# Patient Record
Sex: Female | Born: 1935 | Race: Black or African American | Hispanic: No | Marital: Single | Smoking: Never smoker
Health system: Southern US, Community
[De-identification: ages and names within clinical notes are randomized; demographics above are authoritative.]

## PROBLEM LIST (undated history)

## (undated) DIAGNOSIS — N183 Chronic kidney disease, stage 3 unspecified: Secondary | ICD-10-CM

## (undated) DIAGNOSIS — E119 Type 2 diabetes mellitus without complications: Secondary | ICD-10-CM

## (undated) DIAGNOSIS — D509 Iron deficiency anemia, unspecified: Secondary | ICD-10-CM

## (undated) DIAGNOSIS — I5030 Unspecified diastolic (congestive) heart failure: Secondary | ICD-10-CM

---

## 2015-06-08 DIAGNOSIS — E119 Type 2 diabetes mellitus without complications: Secondary | ICD-10-CM | POA: Diagnosis not present

## 2015-06-08 DIAGNOSIS — E785 Hyperlipidemia, unspecified: Secondary | ICD-10-CM | POA: Diagnosis not present

## 2015-06-08 DIAGNOSIS — I10 Essential (primary) hypertension: Secondary | ICD-10-CM | POA: Diagnosis not present

## 2015-06-19 DIAGNOSIS — E785 Hyperlipidemia, unspecified: Secondary | ICD-10-CM | POA: Diagnosis not present

## 2015-06-19 DIAGNOSIS — Z13220 Encounter for screening for lipoid disorders: Secondary | ICD-10-CM | POA: Diagnosis not present

## 2015-06-19 DIAGNOSIS — E119 Type 2 diabetes mellitus without complications: Secondary | ICD-10-CM | POA: Diagnosis not present

## 2015-06-19 DIAGNOSIS — Z131 Encounter for screening for diabetes mellitus: Secondary | ICD-10-CM | POA: Diagnosis not present

## 2015-07-11 DIAGNOSIS — L84 Corns and callosities: Secondary | ICD-10-CM | POA: Diagnosis not present

## 2015-07-11 DIAGNOSIS — E119 Type 2 diabetes mellitus without complications: Secondary | ICD-10-CM | POA: Diagnosis not present

## 2015-07-11 DIAGNOSIS — M2040 Other hammer toe(s) (acquired), unspecified foot: Secondary | ICD-10-CM | POA: Diagnosis not present

## 2015-07-11 DIAGNOSIS — Z760 Encounter for issue of repeat prescription: Secondary | ICD-10-CM | POA: Diagnosis not present

## 2015-07-11 DIAGNOSIS — E785 Hyperlipidemia, unspecified: Secondary | ICD-10-CM | POA: Diagnosis not present

## 2015-07-11 DIAGNOSIS — I10 Essential (primary) hypertension: Secondary | ICD-10-CM | POA: Diagnosis not present

## 2015-07-11 DIAGNOSIS — B351 Tinea unguium: Secondary | ICD-10-CM | POA: Diagnosis not present

## 2015-07-11 DIAGNOSIS — E1142 Type 2 diabetes mellitus with diabetic polyneuropathy: Secondary | ICD-10-CM | POA: Diagnosis not present

## 2015-07-17 DIAGNOSIS — E119 Type 2 diabetes mellitus without complications: Secondary | ICD-10-CM | POA: Diagnosis not present

## 2015-07-17 DIAGNOSIS — S0003XA Contusion of scalp, initial encounter: Secondary | ICD-10-CM | POA: Diagnosis not present

## 2015-07-17 DIAGNOSIS — E785 Hyperlipidemia, unspecified: Secondary | ICD-10-CM | POA: Diagnosis not present

## 2015-07-17 DIAGNOSIS — R51 Headache: Secondary | ICD-10-CM | POA: Diagnosis not present

## 2015-07-17 DIAGNOSIS — G311 Senile degeneration of brain, not elsewhere classified: Secondary | ICD-10-CM | POA: Diagnosis not present

## 2015-07-17 DIAGNOSIS — Z79899 Other long term (current) drug therapy: Secondary | ICD-10-CM | POA: Diagnosis not present

## 2015-07-17 DIAGNOSIS — S098XXA Other specified injuries of head, initial encounter: Secondary | ICD-10-CM | POA: Diagnosis not present

## 2015-07-17 DIAGNOSIS — M47812 Spondylosis without myelopathy or radiculopathy, cervical region: Secondary | ICD-10-CM | POA: Diagnosis not present

## 2015-07-17 DIAGNOSIS — S199XXA Unspecified injury of neck, initial encounter: Secondary | ICD-10-CM | POA: Diagnosis not present

## 2015-09-05 DIAGNOSIS — E785 Hyperlipidemia, unspecified: Secondary | ICD-10-CM | POA: Diagnosis not present

## 2015-09-05 DIAGNOSIS — Z79899 Other long term (current) drug therapy: Secondary | ICD-10-CM | POA: Diagnosis not present

## 2015-09-05 DIAGNOSIS — M25551 Pain in right hip: Secondary | ICD-10-CM | POA: Diagnosis not present

## 2015-09-05 DIAGNOSIS — E119 Type 2 diabetes mellitus without complications: Secondary | ICD-10-CM | POA: Diagnosis not present

## 2015-09-05 DIAGNOSIS — Z7984 Long term (current) use of oral hypoglycemic drugs: Secondary | ICD-10-CM | POA: Diagnosis not present

## 2015-09-05 DIAGNOSIS — M79604 Pain in right leg: Secondary | ICD-10-CM | POA: Diagnosis not present

## 2016-02-23 DIAGNOSIS — Z1231 Encounter for screening mammogram for malignant neoplasm of breast: Secondary | ICD-10-CM | POA: Diagnosis not present

## 2016-03-04 DIAGNOSIS — E1065 Type 1 diabetes mellitus with hyperglycemia: Secondary | ICD-10-CM | POA: Diagnosis not present

## 2016-03-22 DIAGNOSIS — E7800 Pure hypercholesterolemia, unspecified: Secondary | ICD-10-CM | POA: Diagnosis not present

## 2016-03-22 DIAGNOSIS — D649 Anemia, unspecified: Secondary | ICD-10-CM | POA: Diagnosis not present

## 2016-03-22 DIAGNOSIS — I10 Essential (primary) hypertension: Secondary | ICD-10-CM | POA: Diagnosis not present

## 2016-03-22 DIAGNOSIS — Z0189 Encounter for other specified special examinations: Secondary | ICD-10-CM | POA: Diagnosis not present

## 2016-03-22 DIAGNOSIS — E119 Type 2 diabetes mellitus without complications: Secondary | ICD-10-CM | POA: Diagnosis not present

## 2016-05-27 DIAGNOSIS — E1065 Type 1 diabetes mellitus with hyperglycemia: Secondary | ICD-10-CM | POA: Diagnosis not present

## 2016-05-27 DIAGNOSIS — Z0000 Encounter for general adult medical examination without abnormal findings: Secondary | ICD-10-CM | POA: Diagnosis not present

## 2016-06-05 DIAGNOSIS — E1165 Type 2 diabetes mellitus with hyperglycemia: Secondary | ICD-10-CM | POA: Diagnosis not present

## 2016-06-05 DIAGNOSIS — F79 Unspecified intellectual disabilities: Secondary | ICD-10-CM | POA: Diagnosis not present

## 2016-06-05 DIAGNOSIS — E8889 Other specified metabolic disorders: Secondary | ICD-10-CM | POA: Diagnosis not present

## 2016-06-05 DIAGNOSIS — R32 Unspecified urinary incontinence: Secondary | ICD-10-CM | POA: Diagnosis not present

## 2016-06-10 DIAGNOSIS — R309 Painful micturition, unspecified: Secondary | ICD-10-CM | POA: Diagnosis not present

## 2016-06-10 DIAGNOSIS — E1169 Type 2 diabetes mellitus with other specified complication: Secondary | ICD-10-CM | POA: Diagnosis not present

## 2016-06-10 DIAGNOSIS — L84 Corns and callosities: Secondary | ICD-10-CM | POA: Diagnosis not present

## 2016-06-10 DIAGNOSIS — E119 Type 2 diabetes mellitus without complications: Secondary | ICD-10-CM | POA: Diagnosis not present

## 2016-06-10 DIAGNOSIS — D520 Dietary folate deficiency anemia: Secondary | ICD-10-CM | POA: Diagnosis not present

## 2016-06-10 DIAGNOSIS — E559 Vitamin D deficiency, unspecified: Secondary | ICD-10-CM | POA: Diagnosis not present

## 2016-06-10 DIAGNOSIS — N289 Disorder of kidney and ureter, unspecified: Secondary | ICD-10-CM | POA: Diagnosis not present

## 2016-06-10 DIAGNOSIS — E039 Hypothyroidism, unspecified: Secondary | ICD-10-CM | POA: Diagnosis not present

## 2016-06-10 DIAGNOSIS — M2040 Other hammer toe(s) (acquired), unspecified foot: Secondary | ICD-10-CM | POA: Diagnosis not present

## 2016-06-10 DIAGNOSIS — R809 Proteinuria, unspecified: Secondary | ICD-10-CM | POA: Diagnosis not present

## 2016-06-10 DIAGNOSIS — E785 Hyperlipidemia, unspecified: Secondary | ICD-10-CM | POA: Diagnosis not present

## 2016-06-10 DIAGNOSIS — D649 Anemia, unspecified: Secondary | ICD-10-CM | POA: Diagnosis not present

## 2016-06-10 DIAGNOSIS — I10 Essential (primary) hypertension: Secondary | ICD-10-CM | POA: Diagnosis not present

## 2016-06-10 DIAGNOSIS — M109 Gout, unspecified: Secondary | ICD-10-CM | POA: Diagnosis not present

## 2016-06-26 DIAGNOSIS — E785 Hyperlipidemia, unspecified: Secondary | ICD-10-CM | POA: Diagnosis not present

## 2016-06-26 DIAGNOSIS — R351 Nocturia: Secondary | ICD-10-CM | POA: Diagnosis not present

## 2016-06-26 DIAGNOSIS — E119 Type 2 diabetes mellitus without complications: Secondary | ICD-10-CM | POA: Diagnosis not present

## 2016-06-26 DIAGNOSIS — I10 Essential (primary) hypertension: Secondary | ICD-10-CM | POA: Diagnosis not present

## 2016-06-26 DIAGNOSIS — D649 Anemia, unspecified: Secondary | ICD-10-CM | POA: Diagnosis not present

## 2016-07-29 DIAGNOSIS — E119 Type 2 diabetes mellitus without complications: Secondary | ICD-10-CM | POA: Diagnosis not present

## 2016-07-29 DIAGNOSIS — I10 Essential (primary) hypertension: Secondary | ICD-10-CM | POA: Diagnosis not present

## 2016-07-29 DIAGNOSIS — Z131 Encounter for screening for diabetes mellitus: Secondary | ICD-10-CM | POA: Diagnosis not present

## 2016-07-29 DIAGNOSIS — D649 Anemia, unspecified: Secondary | ICD-10-CM | POA: Diagnosis not present

## 2016-07-31 DIAGNOSIS — E1065 Type 1 diabetes mellitus with hyperglycemia: Secondary | ICD-10-CM | POA: Diagnosis not present

## 2016-10-07 DIAGNOSIS — E1065 Type 1 diabetes mellitus with hyperglycemia: Secondary | ICD-10-CM | POA: Diagnosis not present

## 2016-10-16 DIAGNOSIS — E119 Type 2 diabetes mellitus without complications: Secondary | ICD-10-CM | POA: Diagnosis not present

## 2016-10-16 DIAGNOSIS — I10 Essential (primary) hypertension: Secondary | ICD-10-CM | POA: Diagnosis not present

## 2016-10-16 DIAGNOSIS — Z0189 Encounter for other specified special examinations: Secondary | ICD-10-CM | POA: Diagnosis not present

## 2016-10-16 DIAGNOSIS — N183 Chronic kidney disease, stage 3 (moderate): Secondary | ICD-10-CM | POA: Diagnosis not present

## 2016-10-16 DIAGNOSIS — E785 Hyperlipidemia, unspecified: Secondary | ICD-10-CM | POA: Diagnosis not present

## 2016-11-15 DIAGNOSIS — E1065 Type 1 diabetes mellitus with hyperglycemia: Secondary | ICD-10-CM | POA: Diagnosis not present

## 2017-01-15 DIAGNOSIS — Z0189 Encounter for other specified special examinations: Secondary | ICD-10-CM | POA: Diagnosis not present

## 2017-01-15 DIAGNOSIS — E119 Type 2 diabetes mellitus without complications: Secondary | ICD-10-CM | POA: Diagnosis not present

## 2017-01-15 DIAGNOSIS — N183 Chronic kidney disease, stage 3 (moderate): Secondary | ICD-10-CM | POA: Diagnosis not present

## 2017-01-15 DIAGNOSIS — M25562 Pain in left knee: Secondary | ICD-10-CM | POA: Diagnosis not present

## 2017-01-15 DIAGNOSIS — D649 Anemia, unspecified: Secondary | ICD-10-CM | POA: Diagnosis not present

## 2017-01-16 DIAGNOSIS — M25462 Effusion, left knee: Secondary | ICD-10-CM | POA: Diagnosis not present

## 2017-01-16 DIAGNOSIS — M25562 Pain in left knee: Secondary | ICD-10-CM | POA: Diagnosis not present

## 2017-01-16 DIAGNOSIS — M25469 Effusion, unspecified knee: Secondary | ICD-10-CM | POA: Diagnosis not present

## 2017-01-27 DIAGNOSIS — M1712 Unilateral primary osteoarthritis, left knee: Secondary | ICD-10-CM | POA: Diagnosis not present

## 2017-02-17 DIAGNOSIS — L720 Epidermal cyst: Secondary | ICD-10-CM | POA: Diagnosis not present

## 2017-02-24 DIAGNOSIS — M1712 Unilateral primary osteoarthritis, left knee: Secondary | ICD-10-CM | POA: Diagnosis not present

## 2017-02-26 DIAGNOSIS — I10 Essential (primary) hypertension: Secondary | ICD-10-CM | POA: Diagnosis not present

## 2017-02-26 DIAGNOSIS — D649 Anemia, unspecified: Secondary | ICD-10-CM | POA: Diagnosis not present

## 2017-02-26 DIAGNOSIS — E119 Type 2 diabetes mellitus without complications: Secondary | ICD-10-CM | POA: Diagnosis not present

## 2017-03-13 DIAGNOSIS — E1065 Type 1 diabetes mellitus with hyperglycemia: Secondary | ICD-10-CM | POA: Diagnosis not present

## 2017-03-21 DIAGNOSIS — B351 Tinea unguium: Secondary | ICD-10-CM | POA: Diagnosis not present

## 2017-03-21 DIAGNOSIS — E1142 Type 2 diabetes mellitus with diabetic polyneuropathy: Secondary | ICD-10-CM | POA: Diagnosis not present

## 2017-03-21 DIAGNOSIS — Z1231 Encounter for screening mammogram for malignant neoplasm of breast: Secondary | ICD-10-CM | POA: Diagnosis not present

## 2017-03-21 DIAGNOSIS — M2042 Other hammer toe(s) (acquired), left foot: Secondary | ICD-10-CM | POA: Diagnosis not present

## 2017-03-21 DIAGNOSIS — M2041 Other hammer toe(s) (acquired), right foot: Secondary | ICD-10-CM | POA: Diagnosis not present

## 2017-03-21 DIAGNOSIS — L84 Corns and callosities: Secondary | ICD-10-CM | POA: Diagnosis not present

## 2017-04-10 DIAGNOSIS — L723 Sebaceous cyst: Secondary | ICD-10-CM | POA: Diagnosis not present

## 2017-05-28 DIAGNOSIS — N183 Chronic kidney disease, stage 3 (moderate): Secondary | ICD-10-CM | POA: Diagnosis not present

## 2017-05-28 DIAGNOSIS — E119 Type 2 diabetes mellitus without complications: Secondary | ICD-10-CM | POA: Diagnosis not present

## 2017-05-28 DIAGNOSIS — I10 Essential (primary) hypertension: Secondary | ICD-10-CM | POA: Diagnosis not present

## 2017-05-28 DIAGNOSIS — Z760 Encounter for issue of repeat prescription: Secondary | ICD-10-CM | POA: Diagnosis not present

## 2017-06-12 DIAGNOSIS — M659 Synovitis and tenosynovitis, unspecified: Secondary | ICD-10-CM | POA: Diagnosis not present

## 2017-06-12 DIAGNOSIS — D649 Anemia, unspecified: Secondary | ICD-10-CM | POA: Diagnosis not present

## 2017-06-12 DIAGNOSIS — E119 Type 2 diabetes mellitus without complications: Secondary | ICD-10-CM | POA: Diagnosis not present

## 2017-06-12 DIAGNOSIS — M7989 Other specified soft tissue disorders: Secondary | ICD-10-CM | POA: Diagnosis not present

## 2017-06-12 DIAGNOSIS — M79672 Pain in left foot: Secondary | ICD-10-CM | POA: Diagnosis not present

## 2017-06-12 DIAGNOSIS — M84375A Stress fracture, left foot, initial encounter for fracture: Secondary | ICD-10-CM | POA: Diagnosis not present

## 2017-07-01 DIAGNOSIS — E1142 Type 2 diabetes mellitus with diabetic polyneuropathy: Secondary | ICD-10-CM | POA: Diagnosis not present

## 2017-07-01 DIAGNOSIS — M2041 Other hammer toe(s) (acquired), right foot: Secondary | ICD-10-CM | POA: Diagnosis not present

## 2017-07-01 DIAGNOSIS — M21611 Bunion of right foot: Secondary | ICD-10-CM | POA: Diagnosis not present

## 2017-07-01 DIAGNOSIS — M21612 Bunion of left foot: Secondary | ICD-10-CM | POA: Diagnosis not present

## 2017-07-01 DIAGNOSIS — M2042 Other hammer toe(s) (acquired), left foot: Secondary | ICD-10-CM | POA: Diagnosis not present

## 2017-07-04 DIAGNOSIS — M659 Synovitis and tenosynovitis, unspecified: Secondary | ICD-10-CM | POA: Diagnosis not present

## 2017-07-04 DIAGNOSIS — M84375A Stress fracture, left foot, initial encounter for fracture: Secondary | ICD-10-CM | POA: Diagnosis not present

## 2017-07-08 DIAGNOSIS — M899 Disorder of bone, unspecified: Secondary | ICD-10-CM | POA: Diagnosis not present

## 2017-07-08 DIAGNOSIS — Z13820 Encounter for screening for osteoporosis: Secondary | ICD-10-CM | POA: Diagnosis not present

## 2017-09-08 DIAGNOSIS — D649 Anemia, unspecified: Secondary | ICD-10-CM | POA: Diagnosis not present

## 2017-09-08 DIAGNOSIS — Z760 Encounter for issue of repeat prescription: Secondary | ICD-10-CM | POA: Diagnosis not present

## 2017-09-08 DIAGNOSIS — E119 Type 2 diabetes mellitus without complications: Secondary | ICD-10-CM | POA: Diagnosis not present

## 2017-09-08 DIAGNOSIS — N183 Chronic kidney disease, stage 3 (moderate): Secondary | ICD-10-CM | POA: Diagnosis not present

## 2017-10-16 DIAGNOSIS — D649 Anemia, unspecified: Secondary | ICD-10-CM | POA: Diagnosis not present

## 2017-10-31 DIAGNOSIS — E119 Type 2 diabetes mellitus without complications: Secondary | ICD-10-CM | POA: Diagnosis not present

## 2017-10-31 DIAGNOSIS — J168 Pneumonia due to other specified infectious organisms: Secondary | ICD-10-CM | POA: Diagnosis not present

## 2017-10-31 DIAGNOSIS — N170 Acute kidney failure with tubular necrosis: Secondary | ICD-10-CM | POA: Diagnosis not present

## 2017-10-31 DIAGNOSIS — A419 Sepsis, unspecified organism: Secondary | ICD-10-CM | POA: Diagnosis not present

## 2017-10-31 DIAGNOSIS — J189 Pneumonia, unspecified organism: Secondary | ICD-10-CM | POA: Diagnosis not present

## 2017-10-31 DIAGNOSIS — R1312 Dysphagia, oropharyngeal phase: Secondary | ICD-10-CM | POA: Diagnosis not present

## 2017-10-31 DIAGNOSIS — J440 Chronic obstructive pulmonary disease with acute lower respiratory infection: Secondary | ICD-10-CM | POA: Diagnosis not present

## 2017-10-31 DIAGNOSIS — J441 Chronic obstructive pulmonary disease with (acute) exacerbation: Secondary | ICD-10-CM | POA: Diagnosis not present

## 2017-10-31 DIAGNOSIS — I509 Heart failure, unspecified: Secondary | ICD-10-CM | POA: Diagnosis not present

## 2017-10-31 DIAGNOSIS — Z9981 Dependence on supplemental oxygen: Secondary | ICD-10-CM | POA: Diagnosis not present

## 2017-10-31 DIAGNOSIS — R0600 Dyspnea, unspecified: Secondary | ICD-10-CM | POA: Diagnosis not present

## 2017-10-31 DIAGNOSIS — R0602 Shortness of breath: Secondary | ICD-10-CM | POA: Diagnosis not present

## 2017-10-31 DIAGNOSIS — J449 Chronic obstructive pulmonary disease, unspecified: Secondary | ICD-10-CM | POA: Diagnosis not present

## 2017-10-31 DIAGNOSIS — N271 Small kidney, bilateral: Secondary | ICD-10-CM | POA: Diagnosis not present

## 2017-10-31 DIAGNOSIS — J9601 Acute respiratory failure with hypoxia: Secondary | ICD-10-CM | POA: Diagnosis not present

## 2017-10-31 DIAGNOSIS — D509 Iron deficiency anemia, unspecified: Secondary | ICD-10-CM | POA: Diagnosis not present

## 2017-10-31 DIAGNOSIS — B370 Candidal stomatitis: Secondary | ICD-10-CM | POA: Diagnosis not present

## 2017-10-31 DIAGNOSIS — I361 Nonrheumatic tricuspid (valve) insufficiency: Secondary | ICD-10-CM | POA: Diagnosis not present

## 2017-10-31 DIAGNOSIS — E872 Acidosis: Secondary | ICD-10-CM | POA: Diagnosis not present

## 2017-10-31 DIAGNOSIS — I517 Cardiomegaly: Secondary | ICD-10-CM | POA: Diagnosis not present

## 2017-10-31 DIAGNOSIS — J181 Lobar pneumonia, unspecified organism: Secondary | ICD-10-CM | POA: Diagnosis not present

## 2017-10-31 DIAGNOSIS — I5033 Acute on chronic diastolic (congestive) heart failure: Secondary | ICD-10-CM | POA: Diagnosis not present

## 2017-10-31 DIAGNOSIS — I110 Hypertensive heart disease with heart failure: Secondary | ICD-10-CM | POA: Diagnosis not present

## 2017-10-31 DIAGNOSIS — N179 Acute kidney failure, unspecified: Secondary | ICD-10-CM | POA: Diagnosis not present

## 2017-10-31 DIAGNOSIS — E785 Hyperlipidemia, unspecified: Secondary | ICD-10-CM | POA: Diagnosis not present

## 2017-10-31 DIAGNOSIS — R6520 Severe sepsis without septic shock: Secondary | ICD-10-CM | POA: Diagnosis not present

## 2017-10-31 DIAGNOSIS — R748 Abnormal levels of other serum enzymes: Secondary | ICD-10-CM | POA: Diagnosis not present

## 2017-10-31 DIAGNOSIS — J9691 Respiratory failure, unspecified with hypoxia: Secondary | ICD-10-CM | POA: Diagnosis not present

## 2017-10-31 DIAGNOSIS — I340 Nonrheumatic mitral (valve) insufficiency: Secondary | ICD-10-CM | POA: Diagnosis not present

## 2017-11-06 DIAGNOSIS — E119 Type 2 diabetes mellitus without complications: Secondary | ICD-10-CM | POA: Diagnosis not present

## 2017-11-06 DIAGNOSIS — I110 Hypertensive heart disease with heart failure: Secondary | ICD-10-CM | POA: Diagnosis not present

## 2017-11-06 DIAGNOSIS — E785 Hyperlipidemia, unspecified: Secondary | ICD-10-CM | POA: Diagnosis not present

## 2017-11-06 DIAGNOSIS — D509 Iron deficiency anemia, unspecified: Secondary | ICD-10-CM | POA: Diagnosis not present

## 2017-11-06 DIAGNOSIS — M1990 Unspecified osteoarthritis, unspecified site: Secondary | ICD-10-CM | POA: Diagnosis not present

## 2017-11-06 DIAGNOSIS — I509 Heart failure, unspecified: Secondary | ICD-10-CM | POA: Diagnosis not present

## 2017-11-07 DIAGNOSIS — E119 Type 2 diabetes mellitus without complications: Secondary | ICD-10-CM | POA: Diagnosis not present

## 2017-11-07 DIAGNOSIS — I509 Heart failure, unspecified: Secondary | ICD-10-CM | POA: Diagnosis not present

## 2017-11-07 DIAGNOSIS — E785 Hyperlipidemia, unspecified: Secondary | ICD-10-CM | POA: Diagnosis not present

## 2017-11-07 DIAGNOSIS — D509 Iron deficiency anemia, unspecified: Secondary | ICD-10-CM | POA: Diagnosis not present

## 2017-11-07 DIAGNOSIS — I110 Hypertensive heart disease with heart failure: Secondary | ICD-10-CM | POA: Diagnosis not present

## 2017-11-07 DIAGNOSIS — M1990 Unspecified osteoarthritis, unspecified site: Secondary | ICD-10-CM | POA: Diagnosis not present

## 2017-11-10 DIAGNOSIS — M1990 Unspecified osteoarthritis, unspecified site: Secondary | ICD-10-CM | POA: Diagnosis not present

## 2017-11-10 DIAGNOSIS — E785 Hyperlipidemia, unspecified: Secondary | ICD-10-CM | POA: Diagnosis not present

## 2017-11-10 DIAGNOSIS — D509 Iron deficiency anemia, unspecified: Secondary | ICD-10-CM | POA: Diagnosis not present

## 2017-11-10 DIAGNOSIS — I509 Heart failure, unspecified: Secondary | ICD-10-CM | POA: Diagnosis not present

## 2017-11-10 DIAGNOSIS — E119 Type 2 diabetes mellitus without complications: Secondary | ICD-10-CM | POA: Diagnosis not present

## 2017-11-10 DIAGNOSIS — I110 Hypertensive heart disease with heart failure: Secondary | ICD-10-CM | POA: Diagnosis not present

## 2017-11-12 DIAGNOSIS — E119 Type 2 diabetes mellitus without complications: Secondary | ICD-10-CM | POA: Diagnosis not present

## 2017-11-12 DIAGNOSIS — M1990 Unspecified osteoarthritis, unspecified site: Secondary | ICD-10-CM | POA: Diagnosis not present

## 2017-11-12 DIAGNOSIS — I509 Heart failure, unspecified: Secondary | ICD-10-CM | POA: Diagnosis not present

## 2017-11-12 DIAGNOSIS — I110 Hypertensive heart disease with heart failure: Secondary | ICD-10-CM | POA: Diagnosis not present

## 2017-11-12 DIAGNOSIS — E785 Hyperlipidemia, unspecified: Secondary | ICD-10-CM | POA: Diagnosis not present

## 2017-11-12 DIAGNOSIS — D509 Iron deficiency anemia, unspecified: Secondary | ICD-10-CM | POA: Diagnosis not present

## 2017-11-13 DIAGNOSIS — M1990 Unspecified osteoarthritis, unspecified site: Secondary | ICD-10-CM | POA: Diagnosis not present

## 2017-11-13 DIAGNOSIS — I110 Hypertensive heart disease with heart failure: Secondary | ICD-10-CM | POA: Diagnosis not present

## 2017-11-13 DIAGNOSIS — E119 Type 2 diabetes mellitus without complications: Secondary | ICD-10-CM | POA: Diagnosis not present

## 2017-11-13 DIAGNOSIS — I509 Heart failure, unspecified: Secondary | ICD-10-CM | POA: Diagnosis not present

## 2017-11-13 DIAGNOSIS — D509 Iron deficiency anemia, unspecified: Secondary | ICD-10-CM | POA: Diagnosis not present

## 2017-11-13 DIAGNOSIS — E785 Hyperlipidemia, unspecified: Secondary | ICD-10-CM | POA: Diagnosis not present

## 2017-11-14 DIAGNOSIS — I509 Heart failure, unspecified: Secondary | ICD-10-CM | POA: Diagnosis not present

## 2017-11-14 DIAGNOSIS — D509 Iron deficiency anemia, unspecified: Secondary | ICD-10-CM | POA: Diagnosis not present

## 2017-11-14 DIAGNOSIS — I110 Hypertensive heart disease with heart failure: Secondary | ICD-10-CM | POA: Diagnosis not present

## 2017-11-14 DIAGNOSIS — E785 Hyperlipidemia, unspecified: Secondary | ICD-10-CM | POA: Diagnosis not present

## 2017-11-14 DIAGNOSIS — E119 Type 2 diabetes mellitus without complications: Secondary | ICD-10-CM | POA: Diagnosis not present

## 2017-11-14 DIAGNOSIS — M1990 Unspecified osteoarthritis, unspecified site: Secondary | ICD-10-CM | POA: Diagnosis not present

## 2017-11-19 DIAGNOSIS — M1990 Unspecified osteoarthritis, unspecified site: Secondary | ICD-10-CM | POA: Diagnosis not present

## 2017-11-19 DIAGNOSIS — E785 Hyperlipidemia, unspecified: Secondary | ICD-10-CM | POA: Diagnosis not present

## 2017-11-19 DIAGNOSIS — E119 Type 2 diabetes mellitus without complications: Secondary | ICD-10-CM | POA: Diagnosis not present

## 2017-11-19 DIAGNOSIS — D509 Iron deficiency anemia, unspecified: Secondary | ICD-10-CM | POA: Diagnosis not present

## 2017-11-19 DIAGNOSIS — I110 Hypertensive heart disease with heart failure: Secondary | ICD-10-CM | POA: Diagnosis not present

## 2017-11-19 DIAGNOSIS — I509 Heart failure, unspecified: Secondary | ICD-10-CM | POA: Diagnosis not present

## 2017-11-20 DIAGNOSIS — E785 Hyperlipidemia, unspecified: Secondary | ICD-10-CM | POA: Diagnosis not present

## 2017-11-20 DIAGNOSIS — I110 Hypertensive heart disease with heart failure: Secondary | ICD-10-CM | POA: Diagnosis not present

## 2017-11-20 DIAGNOSIS — M1990 Unspecified osteoarthritis, unspecified site: Secondary | ICD-10-CM | POA: Diagnosis not present

## 2017-11-20 DIAGNOSIS — E119 Type 2 diabetes mellitus without complications: Secondary | ICD-10-CM | POA: Diagnosis not present

## 2017-11-20 DIAGNOSIS — D509 Iron deficiency anemia, unspecified: Secondary | ICD-10-CM | POA: Diagnosis not present

## 2017-11-20 DIAGNOSIS — I509 Heart failure, unspecified: Secondary | ICD-10-CM | POA: Diagnosis not present

## 2017-11-24 DIAGNOSIS — D509 Iron deficiency anemia, unspecified: Secondary | ICD-10-CM | POA: Diagnosis not present

## 2017-11-24 DIAGNOSIS — I509 Heart failure, unspecified: Secondary | ICD-10-CM | POA: Diagnosis not present

## 2017-11-24 DIAGNOSIS — I110 Hypertensive heart disease with heart failure: Secondary | ICD-10-CM | POA: Diagnosis not present

## 2017-11-24 DIAGNOSIS — M1990 Unspecified osteoarthritis, unspecified site: Secondary | ICD-10-CM | POA: Diagnosis not present

## 2017-11-24 DIAGNOSIS — E785 Hyperlipidemia, unspecified: Secondary | ICD-10-CM | POA: Diagnosis not present

## 2017-11-24 DIAGNOSIS — E119 Type 2 diabetes mellitus without complications: Secondary | ICD-10-CM | POA: Diagnosis not present

## 2017-11-26 DIAGNOSIS — M1990 Unspecified osteoarthritis, unspecified site: Secondary | ICD-10-CM | POA: Diagnosis not present

## 2017-11-26 DIAGNOSIS — I110 Hypertensive heart disease with heart failure: Secondary | ICD-10-CM | POA: Diagnosis not present

## 2017-11-26 DIAGNOSIS — D509 Iron deficiency anemia, unspecified: Secondary | ICD-10-CM | POA: Diagnosis not present

## 2017-11-26 DIAGNOSIS — E785 Hyperlipidemia, unspecified: Secondary | ICD-10-CM | POA: Diagnosis not present

## 2017-11-26 DIAGNOSIS — I509 Heart failure, unspecified: Secondary | ICD-10-CM | POA: Diagnosis not present

## 2017-11-26 DIAGNOSIS — E119 Type 2 diabetes mellitus without complications: Secondary | ICD-10-CM | POA: Diagnosis not present

## 2017-11-27 DIAGNOSIS — I509 Heart failure, unspecified: Secondary | ICD-10-CM | POA: Diagnosis not present

## 2017-11-27 DIAGNOSIS — M1990 Unspecified osteoarthritis, unspecified site: Secondary | ICD-10-CM | POA: Diagnosis not present

## 2017-11-27 DIAGNOSIS — I110 Hypertensive heart disease with heart failure: Secondary | ICD-10-CM | POA: Diagnosis not present

## 2017-11-27 DIAGNOSIS — D509 Iron deficiency anemia, unspecified: Secondary | ICD-10-CM | POA: Diagnosis not present

## 2017-11-27 DIAGNOSIS — E119 Type 2 diabetes mellitus without complications: Secondary | ICD-10-CM | POA: Diagnosis not present

## 2017-11-27 DIAGNOSIS — E785 Hyperlipidemia, unspecified: Secondary | ICD-10-CM | POA: Diagnosis not present

## 2017-12-01 DIAGNOSIS — E119 Type 2 diabetes mellitus without complications: Secondary | ICD-10-CM | POA: Diagnosis not present

## 2017-12-01 DIAGNOSIS — I10 Essential (primary) hypertension: Secondary | ICD-10-CM | POA: Diagnosis not present

## 2017-12-01 DIAGNOSIS — Z09 Encounter for follow-up examination after completed treatment for conditions other than malignant neoplasm: Secondary | ICD-10-CM | POA: Diagnosis not present

## 2017-12-02 DIAGNOSIS — E119 Type 2 diabetes mellitus without complications: Secondary | ICD-10-CM | POA: Diagnosis not present

## 2017-12-02 DIAGNOSIS — I509 Heart failure, unspecified: Secondary | ICD-10-CM | POA: Diagnosis not present

## 2017-12-02 DIAGNOSIS — D509 Iron deficiency anemia, unspecified: Secondary | ICD-10-CM | POA: Diagnosis not present

## 2017-12-02 DIAGNOSIS — I110 Hypertensive heart disease with heart failure: Secondary | ICD-10-CM | POA: Diagnosis not present

## 2017-12-02 DIAGNOSIS — E785 Hyperlipidemia, unspecified: Secondary | ICD-10-CM | POA: Diagnosis not present

## 2017-12-02 DIAGNOSIS — M1990 Unspecified osteoarthritis, unspecified site: Secondary | ICD-10-CM | POA: Diagnosis not present

## 2017-12-03 DIAGNOSIS — E119 Type 2 diabetes mellitus without complications: Secondary | ICD-10-CM | POA: Diagnosis not present

## 2017-12-03 DIAGNOSIS — M1990 Unspecified osteoarthritis, unspecified site: Secondary | ICD-10-CM | POA: Diagnosis not present

## 2017-12-03 DIAGNOSIS — D509 Iron deficiency anemia, unspecified: Secondary | ICD-10-CM | POA: Diagnosis not present

## 2017-12-03 DIAGNOSIS — E785 Hyperlipidemia, unspecified: Secondary | ICD-10-CM | POA: Diagnosis not present

## 2017-12-03 DIAGNOSIS — I509 Heart failure, unspecified: Secondary | ICD-10-CM | POA: Diagnosis not present

## 2017-12-03 DIAGNOSIS — I110 Hypertensive heart disease with heart failure: Secondary | ICD-10-CM | POA: Diagnosis not present

## 2017-12-04 DIAGNOSIS — D509 Iron deficiency anemia, unspecified: Secondary | ICD-10-CM | POA: Diagnosis not present

## 2017-12-04 DIAGNOSIS — N179 Acute kidney failure, unspecified: Secondary | ICD-10-CM | POA: Diagnosis not present

## 2017-12-04 DIAGNOSIS — E119 Type 2 diabetes mellitus without complications: Secondary | ICD-10-CM | POA: Diagnosis not present

## 2017-12-04 DIAGNOSIS — I10 Essential (primary) hypertension: Secondary | ICD-10-CM | POA: Diagnosis not present

## 2017-12-04 DIAGNOSIS — I110 Hypertensive heart disease with heart failure: Secondary | ICD-10-CM | POA: Diagnosis not present

## 2017-12-04 DIAGNOSIS — E785 Hyperlipidemia, unspecified: Secondary | ICD-10-CM | POA: Diagnosis not present

## 2017-12-04 DIAGNOSIS — I509 Heart failure, unspecified: Secondary | ICD-10-CM | POA: Diagnosis not present

## 2017-12-04 DIAGNOSIS — M1990 Unspecified osteoarthritis, unspecified site: Secondary | ICD-10-CM | POA: Diagnosis not present

## 2017-12-09 DIAGNOSIS — D509 Iron deficiency anemia, unspecified: Secondary | ICD-10-CM | POA: Diagnosis not present

## 2017-12-09 DIAGNOSIS — M1990 Unspecified osteoarthritis, unspecified site: Secondary | ICD-10-CM | POA: Diagnosis not present

## 2017-12-09 DIAGNOSIS — E119 Type 2 diabetes mellitus without complications: Secondary | ICD-10-CM | POA: Diagnosis not present

## 2017-12-09 DIAGNOSIS — I509 Heart failure, unspecified: Secondary | ICD-10-CM | POA: Diagnosis not present

## 2017-12-09 DIAGNOSIS — I110 Hypertensive heart disease with heart failure: Secondary | ICD-10-CM | POA: Diagnosis not present

## 2017-12-09 DIAGNOSIS — E785 Hyperlipidemia, unspecified: Secondary | ICD-10-CM | POA: Diagnosis not present

## 2017-12-10 DIAGNOSIS — M1990 Unspecified osteoarthritis, unspecified site: Secondary | ICD-10-CM | POA: Diagnosis not present

## 2017-12-10 DIAGNOSIS — I110 Hypertensive heart disease with heart failure: Secondary | ICD-10-CM | POA: Diagnosis not present

## 2017-12-10 DIAGNOSIS — E785 Hyperlipidemia, unspecified: Secondary | ICD-10-CM | POA: Diagnosis not present

## 2017-12-10 DIAGNOSIS — E119 Type 2 diabetes mellitus without complications: Secondary | ICD-10-CM | POA: Diagnosis not present

## 2017-12-10 DIAGNOSIS — I509 Heart failure, unspecified: Secondary | ICD-10-CM | POA: Diagnosis not present

## 2017-12-10 DIAGNOSIS — D509 Iron deficiency anemia, unspecified: Secondary | ICD-10-CM | POA: Diagnosis not present

## 2017-12-11 DIAGNOSIS — I110 Hypertensive heart disease with heart failure: Secondary | ICD-10-CM | POA: Diagnosis not present

## 2017-12-11 DIAGNOSIS — M1990 Unspecified osteoarthritis, unspecified site: Secondary | ICD-10-CM | POA: Diagnosis not present

## 2017-12-11 DIAGNOSIS — E785 Hyperlipidemia, unspecified: Secondary | ICD-10-CM | POA: Diagnosis not present

## 2017-12-11 DIAGNOSIS — E119 Type 2 diabetes mellitus without complications: Secondary | ICD-10-CM | POA: Diagnosis not present

## 2017-12-11 DIAGNOSIS — I509 Heart failure, unspecified: Secondary | ICD-10-CM | POA: Diagnosis not present

## 2017-12-11 DIAGNOSIS — D509 Iron deficiency anemia, unspecified: Secondary | ICD-10-CM | POA: Diagnosis not present

## 2017-12-15 DIAGNOSIS — D509 Iron deficiency anemia, unspecified: Secondary | ICD-10-CM | POA: Diagnosis not present

## 2017-12-15 DIAGNOSIS — I110 Hypertensive heart disease with heart failure: Secondary | ICD-10-CM | POA: Diagnosis not present

## 2017-12-15 DIAGNOSIS — Z09 Encounter for follow-up examination after completed treatment for conditions other than malignant neoplasm: Secondary | ICD-10-CM | POA: Diagnosis not present

## 2017-12-15 DIAGNOSIS — E785 Hyperlipidemia, unspecified: Secondary | ICD-10-CM | POA: Diagnosis not present

## 2017-12-15 DIAGNOSIS — I10 Essential (primary) hypertension: Secondary | ICD-10-CM | POA: Diagnosis not present

## 2017-12-15 DIAGNOSIS — E119 Type 2 diabetes mellitus without complications: Secondary | ICD-10-CM | POA: Diagnosis not present

## 2017-12-15 DIAGNOSIS — M1990 Unspecified osteoarthritis, unspecified site: Secondary | ICD-10-CM | POA: Diagnosis not present

## 2017-12-15 DIAGNOSIS — I509 Heart failure, unspecified: Secondary | ICD-10-CM | POA: Diagnosis not present

## 2017-12-17 DIAGNOSIS — R1312 Dysphagia, oropharyngeal phase: Secondary | ICD-10-CM | POA: Diagnosis not present

## 2017-12-17 DIAGNOSIS — I509 Heart failure, unspecified: Secondary | ICD-10-CM | POA: Diagnosis not present

## 2017-12-17 DIAGNOSIS — J181 Lobar pneumonia, unspecified organism: Secondary | ICD-10-CM | POA: Diagnosis not present

## 2017-12-24 DIAGNOSIS — M1990 Unspecified osteoarthritis, unspecified site: Secondary | ICD-10-CM | POA: Diagnosis not present

## 2017-12-24 DIAGNOSIS — E785 Hyperlipidemia, unspecified: Secondary | ICD-10-CM | POA: Diagnosis not present

## 2017-12-24 DIAGNOSIS — I509 Heart failure, unspecified: Secondary | ICD-10-CM | POA: Diagnosis not present

## 2017-12-24 DIAGNOSIS — E119 Type 2 diabetes mellitus without complications: Secondary | ICD-10-CM | POA: Diagnosis not present

## 2017-12-24 DIAGNOSIS — I110 Hypertensive heart disease with heart failure: Secondary | ICD-10-CM | POA: Diagnosis not present

## 2017-12-24 DIAGNOSIS — D509 Iron deficiency anemia, unspecified: Secondary | ICD-10-CM | POA: Diagnosis not present

## 2018-01-06 DIAGNOSIS — R1312 Dysphagia, oropharyngeal phase: Secondary | ICD-10-CM | POA: Diagnosis not present

## 2018-01-26 DIAGNOSIS — E119 Type 2 diabetes mellitus without complications: Secondary | ICD-10-CM | POA: Diagnosis not present

## 2018-01-26 DIAGNOSIS — I10 Essential (primary) hypertension: Secondary | ICD-10-CM | POA: Diagnosis not present

## 2018-01-26 DIAGNOSIS — N183 Chronic kidney disease, stage 3 (moderate): Secondary | ICD-10-CM | POA: Diagnosis not present

## 2018-03-16 ENCOUNTER — Other Ambulatory Visit: Payer: Self-pay | Admitting: Nurse Practitioner

## 2018-03-16 ENCOUNTER — Ambulatory Visit
Admission: RE | Admit: 2018-03-16 | Discharge: 2018-03-16 | Disposition: A | Discharge status: Home / Self Care | Payer: No Typology Code available for payment source | Source: Ambulatory Visit | Attending: Nurse Practitioner | Admitting: Nurse Practitioner

## 2018-03-16 DIAGNOSIS — W19XXXA Unspecified fall, initial encounter: Secondary | ICD-10-CM

## 2019-11-08 ENCOUNTER — Other Ambulatory Visit (HOSPITAL_COMMUNITY): Payer: Self-pay | Admitting: Internal Medicine

## 2019-11-08 ENCOUNTER — Other Ambulatory Visit: Payer: Self-pay | Admitting: Internal Medicine

## 2019-11-08 DIAGNOSIS — R748 Abnormal levels of other serum enzymes: Secondary | ICD-10-CM

## 2019-12-01 ENCOUNTER — Ambulatory Visit (HOSPITAL_COMMUNITY)
Admission: RE | Admit: 2019-12-01 | Discharge: 2019-12-01 | Disposition: A | Discharge status: Home / Self Care | Payer: Medicare (Managed Care) | Source: Ambulatory Visit | Attending: Internal Medicine | Admitting: Internal Medicine

## 2019-12-01 DIAGNOSIS — R748 Abnormal levels of other serum enzymes: Secondary | ICD-10-CM | POA: Insufficient documentation

## 2020-04-10 ENCOUNTER — Other Ambulatory Visit (HOSPITAL_BASED_OUTPATIENT_CLINIC_OR_DEPARTMENT_OTHER): Payer: Self-pay | Admitting: Nephrology

## 2020-04-10 DIAGNOSIS — N184 Chronic kidney disease, stage 4 (severe): Secondary | ICD-10-CM

## 2020-04-20 ENCOUNTER — Ambulatory Visit (HOSPITAL_BASED_OUTPATIENT_CLINIC_OR_DEPARTMENT_OTHER)
Admission: RE | Admit: 2020-04-20 | Discharge: 2020-04-20 | Disposition: A | Discharge status: Home / Self Care | Payer: Medicare (Managed Care) | Source: Ambulatory Visit | Attending: Nephrology | Admitting: Nephrology

## 2020-04-20 ENCOUNTER — Other Ambulatory Visit: Payer: Self-pay

## 2020-04-20 DIAGNOSIS — N184 Chronic kidney disease, stage 4 (severe): Secondary | ICD-10-CM

## 2020-04-24 ENCOUNTER — Other Ambulatory Visit (HOSPITAL_COMMUNITY): Payer: Self-pay | Admitting: *Deleted

## 2020-04-24 NOTE — Discharge Instructions (Signed)

## 2020-04-25 ENCOUNTER — Inpatient Hospital Stay (HOSPITAL_COMMUNITY)
Admission: RE | Admit: 2020-04-25 | Discharge: 2020-04-25 | Disposition: A | Discharge status: Home / Self Care | Payer: Medicare (Managed Care) | Source: Ambulatory Visit | Attending: Nephrology | Admitting: Nephrology

## 2020-04-25 ENCOUNTER — Encounter (HOSPITAL_COMMUNITY): Payer: Self-pay

## 2020-04-28 ENCOUNTER — Inpatient Hospital Stay (HOSPITAL_COMMUNITY): Admission: RE | Admit: 2020-04-28 | Payer: Medicare (Managed Care) | Source: Ambulatory Visit

## 2020-05-02 ENCOUNTER — Encounter (HOSPITAL_COMMUNITY): Payer: Medicare (Managed Care)

## 2020-05-05 ENCOUNTER — Encounter (HOSPITAL_COMMUNITY): Payer: Medicare (Managed Care)

## 2020-05-10 ENCOUNTER — Other Ambulatory Visit: Payer: Self-pay

## 2020-05-10 ENCOUNTER — Inpatient Hospital Stay (HOSPITAL_COMMUNITY)
Admission: EM | Admit: 2020-05-10 | Discharge: 2020-05-17 | DRG: 871 | Disposition: A | Discharge status: Rehabilitation Facility | Payer: Medicare (Managed Care) | Attending: Internal Medicine | Admitting: Internal Medicine

## 2020-05-10 ENCOUNTER — Inpatient Hospital Stay (HOSPITAL_COMMUNITY): Payer: Medicare (Managed Care)

## 2020-05-10 ENCOUNTER — Emergency Department (HOSPITAL_COMMUNITY): Payer: Medicare (Managed Care)

## 2020-05-10 DIAGNOSIS — R000 Tachycardia, unspecified: Secondary | ICD-10-CM | POA: Diagnosis present

## 2020-05-10 DIAGNOSIS — E869 Volume depletion, unspecified: Secondary | ICD-10-CM | POA: Diagnosis present

## 2020-05-10 DIAGNOSIS — R1310 Dysphagia, unspecified: Secondary | ICD-10-CM | POA: Diagnosis present

## 2020-05-10 DIAGNOSIS — E871 Hypo-osmolality and hyponatremia: Secondary | ICD-10-CM | POA: Diagnosis present

## 2020-05-10 DIAGNOSIS — Z66 Do not resuscitate: Secondary | ICD-10-CM | POA: Diagnosis not present

## 2020-05-10 DIAGNOSIS — D509 Iron deficiency anemia, unspecified: Secondary | ICD-10-CM | POA: Diagnosis present

## 2020-05-10 DIAGNOSIS — F79 Unspecified intellectual disabilities: Secondary | ICD-10-CM | POA: Diagnosis present

## 2020-05-10 DIAGNOSIS — Z7189 Other specified counseling: Secondary | ICD-10-CM | POA: Diagnosis not present

## 2020-05-10 DIAGNOSIS — N1831 Chronic kidney disease, stage 3a: Secondary | ICD-10-CM | POA: Diagnosis present

## 2020-05-10 DIAGNOSIS — R0902 Hypoxemia: Secondary | ICD-10-CM | POA: Diagnosis present

## 2020-05-10 DIAGNOSIS — N183 Chronic kidney disease, stage 3 unspecified: Secondary | ICD-10-CM | POA: Diagnosis not present

## 2020-05-10 DIAGNOSIS — E878 Other disorders of electrolyte and fluid balance, not elsewhere classified: Secondary | ICD-10-CM | POA: Diagnosis present

## 2020-05-10 DIAGNOSIS — I700 Atherosclerosis of aorta: Secondary | ICD-10-CM | POA: Diagnosis present

## 2020-05-10 DIAGNOSIS — N179 Acute kidney failure, unspecified: Secondary | ICD-10-CM | POA: Diagnosis present

## 2020-05-10 DIAGNOSIS — E870 Hyperosmolality and hypernatremia: Secondary | ICD-10-CM | POA: Diagnosis present

## 2020-05-10 DIAGNOSIS — Z79899 Other long term (current) drug therapy: Secondary | ICD-10-CM

## 2020-05-10 DIAGNOSIS — A419 Sepsis, unspecified organism: Secondary | ICD-10-CM | POA: Diagnosis not present

## 2020-05-10 DIAGNOSIS — I5032 Chronic diastolic (congestive) heart failure: Secondary | ICD-10-CM | POA: Diagnosis present

## 2020-05-10 DIAGNOSIS — Z681 Body mass index (BMI) 19 or less, adult: Secondary | ICD-10-CM

## 2020-05-10 DIAGNOSIS — K264 Chronic or unspecified duodenal ulcer with hemorrhage: Secondary | ICD-10-CM | POA: Diagnosis present

## 2020-05-10 DIAGNOSIS — I130 Hypertensive heart and chronic kidney disease with heart failure and stage 1 through stage 4 chronic kidney disease, or unspecified chronic kidney disease: Secondary | ICD-10-CM | POA: Diagnosis present

## 2020-05-10 DIAGNOSIS — Z8711 Personal history of peptic ulcer disease: Secondary | ICD-10-CM

## 2020-05-10 DIAGNOSIS — E1122 Type 2 diabetes mellitus with diabetic chronic kidney disease: Secondary | ICD-10-CM | POA: Diagnosis present

## 2020-05-10 DIAGNOSIS — E119 Type 2 diabetes mellitus without complications: Secondary | ICD-10-CM | POA: Diagnosis not present

## 2020-05-10 DIAGNOSIS — Z8249 Family history of ischemic heart disease and other diseases of the circulatory system: Secondary | ICD-10-CM

## 2020-05-10 DIAGNOSIS — Z515 Encounter for palliative care: Secondary | ICD-10-CM | POA: Diagnosis not present

## 2020-05-10 DIAGNOSIS — Z7984 Long term (current) use of oral hypoglycemic drugs: Secondary | ICD-10-CM

## 2020-05-10 DIAGNOSIS — E876 Hypokalemia: Secondary | ICD-10-CM | POA: Diagnosis present

## 2020-05-10 DIAGNOSIS — Z833 Family history of diabetes mellitus: Secondary | ICD-10-CM

## 2020-05-10 DIAGNOSIS — J690 Pneumonitis due to inhalation of food and vomit: Secondary | ICD-10-CM | POA: Diagnosis present

## 2020-05-10 DIAGNOSIS — Z20822 Contact with and (suspected) exposure to covid-19: Secondary | ICD-10-CM | POA: Diagnosis present

## 2020-05-10 DIAGNOSIS — R69 Illness, unspecified: Secondary | ICD-10-CM

## 2020-05-10 DIAGNOSIS — R636 Underweight: Secondary | ICD-10-CM | POA: Diagnosis present

## 2020-05-10 DIAGNOSIS — I129 Hypertensive chronic kidney disease with stage 1 through stage 4 chronic kidney disease, or unspecified chronic kidney disease: Secondary | ICD-10-CM | POA: Diagnosis not present

## 2020-05-10 DIAGNOSIS — R54 Age-related physical debility: Secondary | ICD-10-CM | POA: Diagnosis present

## 2020-05-10 HISTORY — DX: Unspecified diastolic (congestive) heart failure: I50.30

## 2020-05-10 HISTORY — DX: Type 2 diabetes mellitus without complications: E11.9

## 2020-05-10 HISTORY — DX: Iron deficiency anemia, unspecified: D50.9

## 2020-05-10 HISTORY — DX: Chronic kidney disease, stage 3 unspecified: N18.30

## 2020-05-10 LAB — CBC
HCT: 33.7 % — ABNORMAL LOW (ref 36.0–46.0)
Hemoglobin: 9.6 g/dL — ABNORMAL LOW (ref 12.0–15.0)
MCH: 23.1 pg — ABNORMAL LOW (ref 26.0–34.0)
MCHC: 28.5 g/dL — ABNORMAL LOW (ref 30.0–36.0)
MCV: 81 fL (ref 80.0–100.0)
Platelets: 290 10*3/uL (ref 150–400)
RBC: 4.16 MIL/uL (ref 3.87–5.11)
RDW: 19.2 % — ABNORMAL HIGH (ref 11.5–15.5)
WBC: 18.1 10*3/uL — ABNORMAL HIGH (ref 4.0–10.5)
nRBC: 0.7 % — ABNORMAL HIGH (ref 0.0–0.2)

## 2020-05-10 LAB — COMPREHENSIVE METABOLIC PANEL
ALT: 22 U/L (ref 0–44)
AST: 27 U/L (ref 15–41)
Albumin: 2.4 g/dL — ABNORMAL LOW (ref 3.5–5.0)
Alkaline Phosphatase: 99 U/L (ref 38–126)
Anion gap: 17 — ABNORMAL HIGH (ref 5–15)
BUN: 49 mg/dL — ABNORMAL HIGH (ref 8–23)
CO2: 24 mmol/L (ref 22–32)
Calcium: 8.6 mg/dL — ABNORMAL LOW (ref 8.9–10.3)
Chloride: 116 mmol/L — ABNORMAL HIGH (ref 98–111)
Creatinine, Ser: 4.41 mg/dL — ABNORMAL HIGH (ref 0.44–1.00)
GFR calc Af Amer: 10 mL/min — ABNORMAL LOW (ref >60)
GFR calc non Af Amer: 9 mL/min — ABNORMAL LOW (ref >60)
Glucose, Bld: 303 mg/dL — ABNORMAL HIGH (ref 70–99)
Potassium: 2.8 mmol/L — ABNORMAL LOW (ref 3.5–5.1)
Sodium: 157 mmol/L — ABNORMAL HIGH (ref 135–145)
Total Bilirubin: 0.4 mg/dL (ref 0.3–1.2)
Total Protein: 7.1 g/dL (ref 6.5–8.1)

## 2020-05-10 LAB — URINALYSIS, ROUTINE W REFLEX MICROSCOPIC
Bilirubin Urine: NEGATIVE
Glucose, UA: >=500 mg/dL — AB
Ketones, ur: NEGATIVE mg/dL
Nitrite: NEGATIVE
Protein, ur: >=300 mg/dL — AB
Specific Gravity, Urine: 1.015 (ref 1.005–1.030)
pH: 5 (ref 5.0–8.0)

## 2020-05-10 LAB — PROTIME-INR
INR: 1.1 (ref 0.8–1.2)
Prothrombin Time: 14.2 seconds (ref 11.4–15.2)

## 2020-05-10 LAB — SARS CORONAVIRUS 2 BY RT PCR (HOSPITAL ORDER, PERFORMED IN ~~LOC~~ HOSPITAL LAB): SARS Coronavirus 2: NEGATIVE

## 2020-05-10 LAB — CBG MONITORING, ED
Glucose-Capillary: 227 mg/dL — ABNORMAL HIGH (ref 70–99)
Glucose-Capillary: 330 mg/dL — ABNORMAL HIGH (ref 70–99)

## 2020-05-10 LAB — LACTIC ACID, PLASMA
Lactic Acid, Venous: 4.9 mmol/L (ref 0.5–1.9)
Lactic Acid, Venous: 2.4 mmol/L (ref 0.5–1.9)

## 2020-05-10 LAB — SODIUM, URINE, RANDOM: Sodium, Ur: 35 mmol/L

## 2020-05-10 LAB — APTT: aPTT: 37 seconds — ABNORMAL HIGH (ref 24–36)

## 2020-05-10 LAB — CREATININE, URINE, RANDOM: Creatinine, Urine: 110.96 mg/dL

## 2020-05-10 MED ORDER — INSULIN ASPART 100 UNIT/ML ~~LOC~~ SOLN
0.0000 [IU] | Freq: Three times a day (TID) | SUBCUTANEOUS | Status: DC
  Administered 2020-05-11: 1 [IU] via SUBCUTANEOUS
  Administered 2020-05-11: 5 [IU] via SUBCUTANEOUS
  Administered 2020-05-11: 2 [IU] via SUBCUTANEOUS

## 2020-05-10 MED ORDER — IPRATROPIUM-ALBUTEROL 0.5-2.5 (3) MG/3ML IN SOLN: 3.0000 mL | Freq: Four times a day (QID) | RESPIRATORY_TRACT | Status: DC | PRN

## 2020-05-10 MED ORDER — METOPROLOL SUCCINATE ER 25 MG PO TB24: 25.0000 mg | ORAL_TABLET | Freq: Every day | ORAL | Status: DC

## 2020-05-10 MED ORDER — VANCOMYCIN HCL 750 MG/150ML IV SOLN
750.0000 mg | Freq: Once | INTRAVENOUS | Status: DC
  Filled 2020-05-10: qty 150

## 2020-05-10 MED ORDER — PANTOPRAZOLE SODIUM 40 MG PO TBEC
40.0000 mg | DELAYED_RELEASE_TABLET | Freq: Two times a day (BID) | ORAL | Status: DC
  Filled 2020-05-10 (×2): qty 1

## 2020-05-10 MED ORDER — SODIUM CHLORIDE 0.9 % IV BOLUS (SEPSIS)
1000.0000 mL | Freq: Once | INTRAVENOUS | Status: AC
  Administered 2020-05-10: 1000 mL via INTRAVENOUS

## 2020-05-10 MED ORDER — POTASSIUM CHLORIDE 10 MEQ/100ML IV SOLN
10.0000 meq | INTRAVENOUS | Status: AC
  Administered 2020-05-10 – 2020-05-11 (×4): 10 meq via INTRAVENOUS
  Filled 2020-05-10 (×4): qty 100

## 2020-05-10 MED ORDER — LACTATED RINGERS IV SOLN: INTRAVENOUS | Status: AC

## 2020-05-10 MED ORDER — POTASSIUM CHLORIDE 10 MEQ/100ML IV SOLN
10.0000 meq | INTRAVENOUS | Status: DC
  Administered 2020-05-10 (×2): 10 meq via INTRAVENOUS
  Filled 2020-05-10 (×2): qty 100

## 2020-05-10 MED ORDER — SODIUM CHLORIDE 0.9% FLUSH
3.0000 mL | Freq: Two times a day (BID) | INTRAVENOUS | Status: DC
  Administered 2020-05-10 – 2020-05-17 (×5): 3 mL via INTRAVENOUS

## 2020-05-10 MED ORDER — ENOXAPARIN SODIUM 30 MG/0.3ML ~~LOC~~ SOLN: 30.0000 mg | SUBCUTANEOUS | Status: DC

## 2020-05-10 MED ORDER — SODIUM CHLORIDE 0.9 % IV SOLN
2.0000 g | Freq: Once | INTRAVENOUS | Status: AC
  Administered 2020-05-10: 2 g via INTRAVENOUS
  Filled 2020-05-10: qty 2

## 2020-05-10 MED ORDER — VANCOMYCIN VARIABLE DOSE PER UNSTABLE RENAL FUNCTION (PHARMACIST DOSING): Status: DC

## 2020-05-10 MED ORDER — VANCOMYCIN HCL IN DEXTROSE 1-5 GM/200ML-% IV SOLN
1000.0000 mg | Freq: Once | INTRAVENOUS | Status: DC
  Administered 2020-05-10: 1000 mg via INTRAVENOUS
  Filled 2020-05-10: qty 200

## 2020-05-10 MED ORDER — SODIUM CHLORIDE 0.9% FLUSH
3.0000 mL | Freq: Once | INTRAVENOUS | Status: AC
  Administered 2020-05-10: 3 mL via INTRAVENOUS

## 2020-05-10 MED ORDER — SODIUM CHLORIDE 0.9 % IV SOLN
1.0000 g | INTRAVENOUS | Status: DC
  Administered 2020-05-11: 1 g via INTRAVENOUS
  Filled 2020-05-10 (×2): qty 1

## 2020-05-10 MED ORDER — HEPARIN SODIUM (PORCINE) 5000 UNIT/ML IJ SOLN
5000.0000 [IU] | Freq: Three times a day (TID) | INTRAMUSCULAR | Status: DC
  Administered 2020-05-10 – 2020-05-17 (×19): 5000 [IU] via SUBCUTANEOUS
  Filled 2020-05-10 (×17): qty 1

## 2020-05-10 NOTE — ED Triage Notes (Signed)
Patient arrives to ED triage with [facility] from [facility] with complaints of aspiration pneumonia, anemia, and hyperglycemia. Patient was stated to be hypoxic with audile congestion in both lungs.

## 2020-05-10 NOTE — H&P (Signed)
Date: 05/10/2020               Patient Name:  Tiffany Bridges MRN: [id number]  DOB: Oct 10, 1934 Age / Sex: 86 y.o., female   PCP: Willene Hatchet, NP         Medical Service: Internal Medicine Teaching Service         Attending Physician: Dr. Aldine Contes, MD    First Contact: Dr. Gilford Rile Pager: [phone]  Second Contact: Dr. Sharon Seller Pager: [phone]       After Hours (After 5p/  First Contact Pager: [phone]  weekends / holidays): Second Contact Pager: [phone]   Chief Complaint: Aspiration Pneumonia   History of Present Illness:  Tiffany Bridges is a 86 y/o female with a PMH of intellectual disability, Diastolic heart failure, V7BL, AKI, and iron deficiency anemia presents to the [facility] with concerns for possible sepsis secondary to Aspiration pneumonia. Information obtained from chart review, patient noncontributory during interview. Patient was recently seen at [facility] for acute anemia and upper GI bleed. During her hospital course, she developed aspiration pneumonia and was treated with a course of antibiotics. She recently saw [facility] in the outpatient setting, where is was noted that she had decreased oxygen saturation. She was started on a course of Augmentin and supplemental O2. Her labwork showed an AKI with a creatinine of 4.1 from 1.57 on discharge from [facility]. She was referred to the ED for sepsis due to aspiration pneumonia.   During her ED course, it was found that hyponatremia (157), hypokalemia (2.8), hyperchloremic (116), an AKI 4.41, lactic acidosis of 4.9->2.4 on repeat with fluid initiation, leukocytosis of 18 and anemia of 9.6.   Meds:  Current Meds  Medication Sig  . acetaminophen (TYLENOL) 325 MG tablet Take 650 mg by mouth every 6 (six) hours as needed for mild pain.  Marland Kitchen amLODipine (NORVASC) 10 MG tablet Take 10 mg by mouth daily.  Marland Kitchen epoetin alfa-epbx (RETACRIT) 39030 UNIT/ML injection Inject 10,000 Units into the skin every 14 (fourteen)  days.  . ferrous sulfate 325 (65 FE) MG tablet Take 325 mg by mouth daily with breakfast.  . furosemide (LASIX) 20 MG tablet Take 20 mg by mouth daily.  Marland Kitchen glipiZIDE (GLUCOTROL) 5 MG tablet Take 2.5 mg by mouth daily before breakfast.  . Glucerna (GLUCERNA) LIQD Take 1 Can by mouth in the morning and at bedtime.  Marland Kitchen ipratropium-albuterol (DUONEB) 0.5-2.5 (3) MG/3ML SOLN Take 3 mLs by nebulization 4 (four) times daily as needed (For wheezing, Shortness of breath).  . linagliptin (TRADJENTA) 5 MG TABS tablet Take 5 mg by mouth daily.  . Maltodextrin-Xanthan Gum (RESOURCE THICKENUP CLEAR) POWD Take 1 Can by mouth See admin instructions. Measure recommended amount of thickener. Add to beverage of choice to make nectar like consistency for all liquids.  . metoprolol succinate (TOPROL-XL) 25 MG 24 hr tablet Take 25 mg by mouth daily.  . Multiple Vitamins-Minerals (CENTRUM ADULTS PO) Take 1 tablet by mouth daily.  Marland Kitchen oxybutynin (DITROPAN) 5 MG tablet Take 5 mg by mouth at bedtime.  . pantoprazole (PROTONIX) 40 MG tablet Take 40 mg by mouth 2 (two) times daily.  . sennosides-docusate sodium (SENOKOT-S) 8.6-50 MG tablet Take 1 tablet by mouth daily.  . [DISCONTINUED] amoxicillin-clavulanate (AUGMENTIN) 500-125 MG tablet Take 1 tablet by mouth every 12 (twelve) hours.    Allergies: Allergies as of 05/10/2020  . (No Known Allergies)   No past medical history on file.  Family  History:  Heart Disease: mother and father Diabetes: Mother Cancer: Sister  Social History: Tobacco: None ETOH: None Drugs: None   Review of Systems: A complete ROS was negative except as per HPI.   Physical Exam: Blood pressure 136/65, pulse 71, temperature 98.2 F (36.8 C), temperature source Oral, resp. rate (!) 23, SpO2 93 %. Physical Exam Constitutional:      General: She is not in acute distress.    Appearance: Normal appearance.     Comments: Patient unable to contribute to conversation, unintelligible speech,  whispering. Able to follow some commands. Laying comfortably at bedside. Ill appearing.   Cardiovascular:     Rate and Rhythm: Normal rate and regular rhythm.  Neurological:     Mental Status: She is alert.    EKG: personally reviewed my interpretation is LAFB without ischemic changes  CXR: IMPRESSION: 1. Persistent opacity in the right mid to lower lung, more mass-like in appearance in the recent prior examinations. The possibility of underlying neoplasm should be considered. Further evaluation with contrast enhanced chest CT is suggested if clinically appropriate. 2. Diffuse peribronchial cuffing, concerning for acute bronchitis. 3. Probable trace right pleural effusion. 4. Aortic atherosclerosis.  Assessment & Plan by Problem: Active Problems:   Acute renal failure (ARF) (HCC)  Tiffany Bridges is an 86 yo female with a PMH of intellectual disability, T2DM, AKI, upper GI bleed, who presents with possible sepsis likely secondary to aspiration pneumonia and acute renal failure.    Possible Sepsis in the Setting Aspiration Pneumonia:  Patient presents after a recent hospitalization from [facility]. She initially was admitted at [facility] for an upper GI bleed, but had several chocking events concerning and imaging for concerning aspiration pneumonia. She completed antibiotic therapy while at [facility], but on examination today she has diffuse rhonchi on examination. Her HR and vitals are WNL. She was started on fluid resuscitation with broad spectrum antibiotics in the setting of lactic acidosis and leukocytosis of 18.1. Her imaging shows a opacity in the right mid to lower lung that is mass like in appearance, which may be a be a nidus for repeat infections. Will follow up with CT scan. Patient did have lactic acidosis with anion gap, which is resolving with fluid resuscitation.  - Continue broad spectrum vancomycin and cefepime - F/U blood cultures - F/U Urine culture - F/U Lactic Acid -  F/U MRSA PCR - F/U CT Lung - F/U BMP, CBC, Lactic Acid - LR infusion 50 mL/hr - Cardiac monitoring  - Aspiration precautions  - Pulse ox - PT/OT Eval and treat  Acute Renal Failure:  Patient with a baseline Cr of 1.57 appears with Cr of 4.41. Patient appears euvolemic on physical examination. Her kidney function may be due secondary to possible sepsis with prerenal etiology. Will obtain urine studies to differentiate between prerenal and intrinsic disease.  - Urine Sodium  - Urine Creatinine   DMT2:  - SSI sensitive - A1c Pending  Hx of Upper GI Bleed:  - Protonix 40 mg BID  Electrolyte Imbalances Hypokalemia Hyponatremia: Potassium of 2.8, hypernatremic, hypochloremic currently being resuscitated with fluids and potassium replacement.  - Follow up BMP - Correct as needed.     Dispo: Admit patient to Inpatient with expected length of stay greater than 2 midnights.  Signed: Maudie Mercury, MD 05/10/2020, 5:57 PM  Pager: [phone] After 5pm on weekdays and 1pm on weekends: On Call pager: [phone]

## 2020-05-10 NOTE — ED Notes (Signed)
Attempted to obtain 2nd set of cultures x2, unsuccessful. Only one set collected by this RN prior to starting abx (did not want to delay tx for abx further)

## 2020-05-10 NOTE — Sepsis Progress Note (Signed)
Notified bedside nurse of need to draw repeat lactic acid after fluid bolus.

## 2020-05-10 NOTE — Progress Notes (Signed)
Pharmacy Antibiotic Note  Tiffany Bridges is a 86 y.o. female admitted on 05/10/2020 with sepsis likely 2/2 aspiration pneumonia.  Pharmacy has been consulted for vancomycin and cefepime dosing.  Patient afebrile. CXR with a persistent opacity in right mid to lower lung. Patient's last weight is 42.3 kg from 04/26/2020. Creatinine here is 4.41, last creatinine at outside institution was 1.57 on 6/15. eCrCl ~7 ml/min. WBC count 18.1. Cefepime last admininstered at 1526, vanco at 1537. Of note, 1000 mg vanco was infusing at time the 750 mg load was ordered so the initial vancomycin infusion was finished.   Plan: Vancomycin variable dosing for unstable renal function  Cefepime 1 gram IV every 24 hours  Monitor renal function, clinical status, de-escalation, MRSA PCR swab, LOT  Temp (24hrs), Avg:98.2 F (36.8 C), Min:98.2 F (36.8 C), Max:98.2 F (36.8 C)  Recent Labs  Lab 05/10/20 1335 05/10/20 1554  WBC 18.1*  --   CREATININE 4.41*  --   LATICACIDVEN 4.9* 2.4*    CrCl cannot be calculated (Unknown ideal weight.).    No Known Allergies  Antimicrobials this admission: cefepime 6/23 >>  Vancomycin 6/23 >>   Dose adjustments this admission: N/A  Microbiology results: 6/23 BCx: sent 6/23 UCx: sent  6/23 MRSA PCR: sent  Thank you for allowing pharmacy to be a part of this patient's care.  Eddie Candle, PharmD PGY-1 Pharmacy Resident   Please check amion for clinical pharmacist contact number 05/10/2020 5:25 PM

## 2020-05-10 NOTE — ED Provider Notes (Signed)
[facility] EMERGENCY DEPARTMENT Provider Note   CSN: [id number] Arrival date & time: 05/10/20  1303     History Chief Complaint  Patient presents with  . Pneumonia  . Abnormal Lab    Tiffany Bridges is a 86 y.o. female.  HPI Patient is sent in by [facility].  She was hospitalized June 8 at [facility] for acute anemia and upper GI bleed.  Patient did have a 1 cm ulcer.  He developed aspiration pneumonia and was treated with antibiotics.  Patient was discharged to SNF on June 17 and discharged on June 21.  Patient was seen in [facility] clinic yesterday and noted to have low oxygen saturation.  Augmentin was started and patient started on supplemental oxygen.  Lab work obtained.  Labs returned to show significant AKI compared to previous, creatinine at discharge was 1.57 and value obtained from yesterday at 4.1.  Reportedly noted to be febrile but temperature not documented.  Patient referred to the emergency department for presumably worsening sepsis due to aspiration pneumonia with hypoxia tachycardia and low-grade fever with AKI.  Patient has intellectual disability.  She is a limited historian however she reports that she feels very bad.  She reports that she aches all over.  She denies abdominal pain.    No past medical history on file.  There are no problems to display for this patient.      OB History   No obstetric history on file.     No family history on file.  Social History   Tobacco Use  . Smoking status: Not on file  Substance Use Topics  . Alcohol use: Not on file  . Drug use: Not on file    Home Medications Prior to Admission medications   Not on File    Allergies    Patient has no allergy information on record.  Review of Systems   Review of Systems Level 5 caveat cannot obtain review of systems due to patient intellectual disability. Physical Exam Updated Vital Signs BP (!) 132/54 (BP Location: Right Arm)    Pulse 65   Temp 98.2 F (36.8 C) (Oral)   Resp 20   SpO2 (!) 89%   Physical Exam Constitutional:      Comments: Patient is awake and alert.  She is communicating with me.  Her speech is very difficult to understand but does seem to be directed at me and answering my questions.  Wet intermittent cough.  Mild tachypnea.  HENT:     Head: Normocephalic and atraumatic.     Mouth/Throat:     Comments: Oral cavity is very dry.  Dentition has extensive grayish-black staining. Eyes:     Extraocular Movements: Extraocular movements intact.  Cardiovascular:     Comments: Heart is regular.  2 out of 6 systolic ejection murmur. Pulmonary:     Comments: Mild to moderate tachypnea.  Rhonchi diffusely.  Wet intermittent cough. Abdominal:     General: There is no distension.     Palpations: Abdomen is soft.     Tenderness: There is no abdominal tenderness. There is no guarding.  Musculoskeletal:     Comments: Extremities have significant muscular atrophy.  Patient has edema local to the ankles and the feet.  Extremities are warm and dry.  Skin:    General: Skin is warm and dry.  Neurological:     Comments: Patient is alert and focused on our interactions.  She does make some intelligible  sentences although it appears there is some baseline speech impediment and intellectual delay, making her difficult to understand.  She will follow some commands to help me examine.  She will reach across the stretcher and grab the opposite hand rail to assist in lung exam.  She can do this bilaterally.     ED Results / Procedures / Treatments   Labs (all labs ordered are listed, but only abnormal results are displayed) Labs Reviewed  COMPREHENSIVE METABOLIC PANEL - Abnormal; Notable for the following components:      Result Value   Sodium 157 (*)    Potassium 2.8 (*)    Chloride 116 (*)    Glucose, Bld 303 (*)    BUN 49 (*)    Creatinine, Ser 4.41 (*)    Calcium 8.6 (*)    Albumin 2.4 (*)    GFR calc  non Af Amer 9 (*)    GFR calc Af Amer 10 (*)    Anion gap 17 (*)    All other components within normal limits  CBC - Abnormal; Notable for the following components:   WBC 18.1 (*)    Hemoglobin 9.6 (*)    HCT 33.7 (*)    MCH 23.1 (*)    MCHC 28.5 (*)    RDW 19.2 (*)    nRBC 0.7 (*)    All other components within normal limits  LACTIC ACID, PLASMA - Abnormal; Notable for the following components:   Lactic Acid, Venous 4.9 (*)    All other components within normal limits  URINALYSIS, ROUTINE W REFLEX MICROSCOPIC - Abnormal; Notable for the following components:   APPearance HAZY (*)    Glucose, UA >=500 (*)    Hgb urine dipstick SMALL (*)    Protein, ur >=300 (*)    Leukocytes,Ua MODERATE (*)    Bacteria, UA FEW (*)    All other components within normal limits  CBG MONITORING, ED - Abnormal; Notable for the following components:   Glucose-Capillary 227 (*)    All other components within normal limits  CULTURE, BLOOD (ROUTINE X 2)  CULTURE, BLOOD (ROUTINE X 2)  URINE CULTURE  MRSA PCR SCREENING  LACTIC ACID, PLASMA  APTT  PROTIME-INR    EKG EKG Interpretation  Date/Time:  Wednesday May 10 2020 14:35:23 EDT Ventricular Rate:  76 PR Interval:    QRS Duration: 84 QT Interval:  480 QTC Calculation: 540 R Axis:   -60 Text Interpretation: Sinus rhythm Left anterior fascicular block Abnormal R-wave progression, early transition LVH with secondary repolarization abnormality Prolonged QT interval agree, no acute schemic change. no old comparison available. Confirmed by Charlesetta Shanks [id number]) on 05/10/2020 4:10:08 PM   Radiology DG Chest Port 1 View  Result Date: 05/10/2020 CLINICAL DATA:  86 year old female with history of fever and shortness of breath. Possible aspiration pneumonia. EXAM: PORTABLE CHEST 1 VIEW COMPARISON:  Chest x-ray 04/30/2020. FINDINGS: Persistent opacity throughout the right mid to lower lung, more mass-like in appearance than the prior study. Diffuse  peribronchial cuffing. Possible trace right pleural effusion. No definite left pleural effusion. No pneumothorax. No evidence of pulmonary edema. Heart size is borderline enlarged. Upper mediastinal contours are within normal limits. Aortic atherosclerosis. IMPRESSION: 1. Persistent opacity in the right mid to lower lung, more mass-like in appearance in the recent prior examinations. The possibility of underlying neoplasm should be considered. Further evaluation with contrast enhanced chest CT is suggested if clinically appropriate. 2. Diffuse peribronchial cuffing, concerning for acute bronchitis. 3. Probable  trace right pleural effusion. 4. Aortic atherosclerosis. Electronically Signed   By: Vinnie Langton M.D.   On: 05/10/2020 15:53    Procedures Procedures (including critical care time) CRITICAL CARE Performed by: Charlesetta Shanks   Total critical care time: 30  minutes  Critical care time was exclusive of separately billable procedures and treating other patients.  Critical care was necessary to treat or prevent imminent or life-threatening deterioration.  Critical care was time spent personally by me on the following activities: development of treatment plan with patient and/or surrogate as well as nursing, discussions with consultants, evaluation of patient's response to treatment, examination of patient, obtaining history from patient or surrogate, ordering and performing treatments and interventions, ordering and review of laboratory studies, ordering and review of radiographic studies, pulse oximetry and re-evaluation of patient's condition. Medications Ordered in ED Medications  sodium chloride flush (NS) 0.9 % injection 3 mL (has no administration in time range)  potassium chloride 10 mEq in 100 mL IVPB (10 mEq Intravenous New Bag/Given 05/10/20 1534)  vancomycin (VANCOREADY) IVPB 750 mg/150 mL (has no administration in time range)  vancomycin variable dose per unstable renal function  (pharmacist dosing) (has no administration in time range)  sodium chloride 0.9 % bolus 1,000 mL (1,000 mLs Intravenous New Bag/Given 05/10/20 1533)  ceFEPIme (MAXIPIME) 2 g in sodium chloride 0.9 % 100 mL IVPB (2 g Intravenous New Bag/Given 05/10/20 1526)    ED Course  I have reviewed the triage vital signs and the nursing notes.  Pertinent labs & imaging results that were available during my care of the patient were reviewed by me and considered in my medical decision making (see chart for details).    MDM Rules/Calculators/A&P                          [facility] for worsening condition posthospitalization and pre-existing diagnosis of aspiration pneumonia.  Patient is awake and alert.  She does have wet cough but respiratory status is stable at this time.  Patient has significant AKI as compared to her values at discharge.  Also, significant hypernatremia, suspect these are due to dehydration.  Patient is started on fluid resuscitation and antibiotics.  Patient is significantly hypokalemic, 3 rounds of 10 mEq IV potassium ordered.  She will require readmission. Final Clinical Impression(s) / ED Diagnoses Final diagnoses:  Aspiration pneumonia, unspecified aspiration pneumonia type, unspecified laterality, unspecified part of lung (HCC)  Severe comorbid illness  AKI (acute kidney injury) ([place])  Hypernatremia    Rx / DC Orders ED Discharge Orders    None       Charlesetta Shanks, MD 05/10/20 1614

## 2020-05-10 NOTE — ED Notes (Signed)
[facility] can be contacted for information about previous status. Main # is [phone] and ask for Marcie Bal. Magda Paganini can be reached at [phone] with [facility]

## 2020-05-10 NOTE — ED Notes (Signed)
Pt put on 2L [place] in triage

## 2020-05-11 ENCOUNTER — Inpatient Hospital Stay (HOSPITAL_COMMUNITY): Payer: Medicare (Managed Care)

## 2020-05-11 DIAGNOSIS — J690 Pneumonitis due to inhalation of food and vomit: Secondary | ICD-10-CM

## 2020-05-11 LAB — BASIC METABOLIC PANEL
Anion gap: 12 (ref 5–15)
Anion gap: 11 (ref 5–15)
BUN: 49 mg/dL — ABNORMAL HIGH (ref 8–23)
BUN: 48 mg/dL — ABNORMAL HIGH (ref 8–23)
CO2: 22 mmol/L (ref 22–32)
CO2: 23 mmol/L (ref 22–32)
Calcium: 7.5 mg/dL — ABNORMAL LOW (ref 8.9–10.3)
Calcium: 7.4 mg/dL — ABNORMAL LOW (ref 8.9–10.3)
Chloride: 116 mmol/L — ABNORMAL HIGH (ref 98–111)
Chloride: 115 mmol/L — ABNORMAL HIGH (ref 98–111)
Creatinine, Ser: 3.54 mg/dL — ABNORMAL HIGH (ref 0.44–1.00)
Creatinine, Ser: 3.55 mg/dL — ABNORMAL HIGH (ref 0.44–1.00)
GFR calc Af Amer: 13 mL/min — ABNORMAL LOW (ref >60)
GFR calc Af Amer: 13 mL/min — ABNORMAL LOW (ref >60)
GFR calc non Af Amer: 11 mL/min — ABNORMAL LOW (ref >60)
GFR calc non Af Amer: 11 mL/min — ABNORMAL LOW (ref >60)
Glucose, Bld: 358 mg/dL — ABNORMAL HIGH (ref 70–99)
Glucose, Bld: 329 mg/dL — ABNORMAL HIGH (ref 70–99)
Potassium: 4.2 mmol/L (ref 3.5–5.1)
Potassium: 4.2 mmol/L (ref 3.5–5.1)
Sodium: 150 mmol/L — ABNORMAL HIGH (ref 135–145)
Sodium: 149 mmol/L — ABNORMAL HIGH (ref 135–145)

## 2020-05-11 LAB — CBC
HCT: 29.6 % — ABNORMAL LOW (ref 36.0–46.0)
Hemoglobin: 8.4 g/dL — ABNORMAL LOW (ref 12.0–15.0)
MCH: 22.8 pg — ABNORMAL LOW (ref 26.0–34.0)
MCHC: 28.4 g/dL — ABNORMAL LOW (ref 30.0–36.0)
MCV: 80.2 fL (ref 80.0–100.0)
Platelets: 254 10*3/uL (ref 150–400)
RBC: 3.69 MIL/uL — ABNORMAL LOW (ref 3.87–5.11)
RDW: 19.2 % — ABNORMAL HIGH (ref 11.5–15.5)
WBC: 18.1 10*3/uL — ABNORMAL HIGH (ref 4.0–10.5)
nRBC: 0.5 % — ABNORMAL HIGH (ref 0.0–0.2)

## 2020-05-11 LAB — HEMOGLOBIN A1C
Hgb A1c MFr Bld: 8.3 % — ABNORMAL HIGH (ref 4.8–5.6)
Mean Plasma Glucose: 191.51 mg/dL

## 2020-05-11 LAB — URINE CULTURE: Culture: 20000 — AB

## 2020-05-11 LAB — CBG MONITORING, ED
Glucose-Capillary: 274 mg/dL — ABNORMAL HIGH (ref 70–99)
Glucose-Capillary: 172 mg/dL — ABNORMAL HIGH (ref 70–99)
Glucose-Capillary: 135 mg/dL — ABNORMAL HIGH (ref 70–99)

## 2020-05-11 LAB — MAGNESIUM: Magnesium: 2.6 mg/dL — ABNORMAL HIGH (ref 1.7–2.4)

## 2020-05-11 LAB — LACTIC ACID, PLASMA: Lactic Acid, Venous: 1.4 mmol/L (ref 0.5–1.9)

## 2020-05-11 LAB — GLUCOSE, CAPILLARY: Glucose-Capillary: 134 mg/dL — ABNORMAL HIGH (ref 70–99)

## 2020-05-11 LAB — MRSA PCR SCREENING: MRSA by PCR: NEGATIVE

## 2020-05-11 MED ORDER — LACTATED RINGERS IV SOLN: INTRAVENOUS | Status: AC

## 2020-05-11 NOTE — Progress Notes (Signed)
NEW ADMISSION NOTE New Admission Note:   Arrival Method: Patient admitted from ED on a stretcher accompanied by the staff. Mental Orientation: pt not talking or answering to any questions Telemetry: 36M-12 Assessment: Completed Skin: Dry and intact, +2-3 edema in both feet. IV: Left AC Pain:  Denies any Tubes: purewick Safety Measures: Safety Fall Prevention Plan has been given, discussed and signed Admission: Completed [facility] [place] Orientation: Patient has been orientated to the room, unit and staff.  Family: N/A  Orders have been reviewed and implemented. Will continue to monitor the patient. Call light has been placed within reach and bed alarm has been activated.   Amaryllis Dyke, RN

## 2020-05-11 NOTE — ED Notes (Signed)
Checked patient cbg it was 63 notified RN of blood sugar

## 2020-05-11 NOTE — ED Notes (Signed)
Tele

## 2020-05-11 NOTE — Progress Notes (Signed)
While rounding on ED [place] Chaplain visited patient.  She was non-communicative though calling out.  Nurse attended patient.  De [place] Chaplain Resident

## 2020-05-11 NOTE — ED Notes (Signed)
Tele Dinner ordered 

## 2020-05-11 NOTE — Progress Notes (Signed)
Date: 05/11/2020  Patient name: Tiffany Bridges  Medical record number: [id number]  Date of birth: 1934-08-31   I have seen and evaluated Norlene Campbell and discussed their care with the Residency Team.  In brief, patient is an 86 year old female with a past medical history of intellectual disability, chronic diastolic heart failure, type 2 diabetes, CKD stage IIIa, iron deficiency anemia who presented to the ED with hypoxia and AKI from [facility].  History obtained from chart as patient is unable to provide history at this time.  Per chart, patient was recently seen at [facility] for acute anemia and upper GI bleed.  During her hospital course, she developed aspiration pneumonia and was treated with a course of antibiotics.  She [facility] up with [facility] as an outpatient and was noted to be hypoxic at that time.  She was started on a course of Augmentin and supplemental O2 by her physician.  Blood work done at that time showed an AKI with a creatinine of 4.1 up from 1.57 on discharge from [facility].  She was referred to the ED for further evaluation.  Difficult to obtain review of systems on this patient but she denies any complaints at this time.  Today, patient is resting comfortably in bed and states that she is fine in response to questions.  She does appear sleepy but is easily arousable.  PMHx, Fam Hx, and/or Soc Hx : As per resident admit note  Vitals:   05/11/20 0949 05/11/20 1221  BP: (!) 101/49 (!) 134/115  Pulse: 63 82  Resp: 19 (!) 27  Temp:    SpO2: 96% 95%   General: Sleepy but easily arousable, NAD CVS: Regular rate and rhythm, normal heart sounds Lungs: Bilateral rhonchi noted on exam Abdomen: Soft, nontender, nondistended, normoactive bowel sounds Extremities: No edema noted, nontender to palpation Psych: Patient appears calm and alert HEENT: Normocephalic, atraumatic Skin: Warm and dry  Assessment and Plan: I have seen and evaluated the  patient as outlined above. I agree with the formulated Assessment and Plan as detailed in the residents' note, with the following changes:   1.  Multifocal pneumonia: -Patient presented to the ED from her PCPs office with hypoxia and AKI in the setting of a recent aspiration pneumonia and was found to have infiltrates in her bilateral upper lobes as well as her right lower lobe concerning for multifocal pneumonia.  Patient was mildly hypoxic (89) and tachypneic on admission and was noted to have lactic acidosis up to 4 and leukocytosis up to 18 consistent with an underlying infection. -Blood cultures no growth to date -We will DC vancomycin continue cefepime today.  Would consider narrowing coverage to Unasyn tomorrow. -We will follow-up urine culture -Patient still with persistent leukocytosis up to 18.  We will monitor CBC closely. -SLP to evaluate the patient.  Will need to continue with aspiration precautions -We will follow PT/OT evaluation  2.  AKI on CKD, hypernatremia: -Patient presented to the ED with AKI noted on blood work from her PCPs office.  Patient's creatinine here was up to 4.41 from baseline of 1.57. -Patient was also noted to be hypernatremic with a sodium of 157 as well as had a fractional excretion of sodium of 0.8 consistent with a prerenal etiology.  I suspect that the patient is extremely volume depleted. -We will continue with IV fluids for now.  Sodium is improved to 149 and creatinine is improved to 3.55. -We will continue to monitor  closely.  Aldine Contes, MD 6/24/202112:37 PM

## 2020-05-11 NOTE — ED Notes (Signed)
Lunch Tray Ordered @ 1052.  

## 2020-05-11 NOTE — Progress Notes (Signed)
Pt has very elevated diastolic BP, will review her at another time to see if more medically stable.   05/11/20 1400  PT Visit Information  Last PT Received On 05/11/20  Reason Eval/Treat Not Completed Medical issues which prohibited therapy    Mee Hives, PT MS Acute Rehab Dept. Number: [place]

## 2020-05-11 NOTE — Progress Notes (Signed)
Subjective:  O/N Events: Admitted  Tiffany Bridges was seen at bedside this AM. She was resting comfortably in bed. She requires considerable prompting to answer questions, and difficult to assess mental status or orientation. She appears sleepy and denies abdominal pain, shortness of breath or chest pain.  Objective:  Vital signs in last 24 hours: Vitals:   05/10/20 2200 05/11/20 0030 05/11/20 0400 05/11/20 0600  BP: 128/61 129/72 119/62 (!) 122/54  Pulse: 60 61 65 63  Resp:  20 19 19   Temp:      TempSrc:      SpO2: 95% 97% 93% 98%   Physical Exam Constitutional:      General: She is not in acute distress.    Appearance: She is normal weight.     Comments: Resting comfortably in bed, requires multiple prompts to answer questions. No acute distress.   Cardiovascular:     Rate and Rhythm: Normal rate and regular rhythm.     Pulses: Normal pulses.     Heart sounds: Normal heart sounds. No murmur heard.  No friction rub. No gallop.   Pulmonary:     Effort: Pulmonary effort is normal.     Breath sounds: Rhonchi present. No wheezing or rales.     Comments: Rhonchi present bilaterally in all lung fields. Left sided rhonchi sounds improved from yesterday, R side approximately the same on ascultation.  Abdominal:     General: Abdomen is flat. Bowel sounds are normal.     Tenderness: There is no abdominal tenderness.  Neurological:     Mental Status: She is alert.     CBC Latest Ref Rng & Units 05/11/2020 05/10/2020  WBC 4.0 - 10.5 K/uL 18.1(H) 18.1(H)  Hemoglobin 12.0 - 15.0 g/dL 8.4(L) 9.6(L)  Hematocrit 36 - 46 % 29.6(L) 33.7(L)  Platelets 150 - 400 K/uL 254 290    BMP Latest Ref Rng & Units 05/11/2020 05/11/2020 05/10/2020  Glucose 70 - 99 mg/dL 329(H) 358(H) 303(H)  BUN 8 - 23 mg/dL 48(H) 49(H) 49(H)  Creatinine 0.44 - 1.00 mg/dL 3.55(H) 3.54(H) 4.41(H)  Sodium 135 - 145 mmol/L 149(H) 150(H) 157(H)  Potassium 3.5 - 5.1 mmol/L 4.2 4.2 2.8(L)  Chloride 98 - 111 mmol/L 115(H)  116(H) 116(H)  CO2 22 - 32 mmol/L 23 22 24   Calcium 8.9 - 10.3 mg/dL 7.4(L) 7.5(L) 8.6(L)    IMAGING:  TECHNIQUE: Multidetector CT imaging of the chest was performed following the standard protocol without IV contrast.  COMPARISON:  None  FINDINGS: Cardiovascular: Moderate cardiac enlargement. Duplicated superior vena cava noted. No pericardial effusion. Aortic atherosclerosis. Lad, left circumflex and RCA coronary artery calcifications. Prominent right ventricular outflow track is noted which may reflect underlying PA hypertension.  Mediastinum/Nodes: Normal appearance of the thyroid gland. The trachea appears patent and is midline. Normal appearance of the esophagus.  No axillary adenopathy. No mediastinal adenopathy. Hilar structures are suboptimally evaluated due to lack of IV contrast material.  Lungs/Pleura: Small right pleural effusion identified. Mild diffuse interlobular septal thickening and ground-glass attenuation is noted throughout both lungs suggesting pulmonary edema. Additionally, there are multifocal airspace opacities involving both upper lobes and the right lower lobe concerning for multifocal pneumonia. Subsegmental atelectasis and mild peribronchovascular airspace densities noted in the left lower lobe.  Upper Abdomen: No acute abnormality within the upper abdomen.  Musculoskeletal: Thoracic spondylosis identified. No acute or suspicious osseous findings.  IMPRESSION: 1. Multifocal airspace opacities involving both upper lobes and the right lower lobe concerning for multifocal pneumonia. 2.  Cardiac enlargement, small right pleural effusion and mild pulmonary edema compatible with CHF. 3. Multi vessel coronary artery calcifications noted. 4. Duplicated superior vena cava. 5. Aortic atherosclerosis.  Aortic Atherosclerosis (ICD10-I70.0).  Assessment/Plan:  Active Problems:   Acute renal failure (ARF) (HCC)  Tiffany Bridges is an 86 yo  female with a PMH of intellectual disability, T2DM, AKI, upper GI bleed, who presents with possible sepsis likely secondary to aspiration pneumonia and acute renal failure.    Multifocal Pneumonia:  Patient presents after a recent hospitalization from [facility]. She initially was admitted at [facility] for an upper GI bleed, but had several chocking events concerning and imaging for concerning aspiration pneumonia. She completed antibiotic therapy while at [facility], but on examination today she has diffuse rhonchi on examination. Her HR and vitals are WNL. She was started on fluid resuscitation with broad spectrum antibiotics in the setting of lactic acidosis and leukocytosis of 18.1. Her imaging shows a opacity in the right mid to lower lung that is mass like in appearance, which may be a be a nidus for repeat infections. Will follow up with CT scan.  - Continue cefepime - DC Vanc - blood cultures: NGTD <24 hours - Urine culture: Sent - Lactic Acid: 1.4 - MRSA PCR: Negative - Continue trending CBC  - Cardiac monitoring  - Aspiration precautions  - Pulse ox - PT/OT Eval and treat  Acute Renal Failure:  Patient with a baseline Cr of 1.57 appears with Cr of 4.41. Patient appears euvolemic on physical examination. Her kidney function may be due secondary to possible sepsis with prerenal etiology. Will obtain urine studies to differentiate between prerenal and intrinsic etiology. FeNA score 0.8% suggesting pre renal etiology, will follow Cr. Order renal U/S today.  - BMP - Renal U/S   DMT2:  - SSI sensitive - A1c Pending  Hx of Upper GI Bleed:  - Protonix 40 mg BID  Electrolyte Imbalances Hypokalemia Hyponatremia: Potassium of 4.2, hypernatremia improving - Follow up BMP - Correcting as needed.     Prior to Admission Living Arrangement: [facility] Anticipated Discharge Location: Pending PT/OT recommendations Barriers to Discharge: Continued medical treatment Dispo: Anticipated  discharge in approximately 3-4 day(s).   Maudie Mercury, MD 05/11/2020, 7:36 AM Pager: [phone] After 5pm on weekdays and 1pm on weekends: On Call pager [phone]

## 2020-05-11 NOTE — ED Notes (Signed)
Pt offered lunch and assistance to eat. Asked pt if she wanted food and she shook her head no, asked if she wanted to rest and she nodded yes. I asked both questions again using different wording to clarify and her answers remained the same.

## 2020-05-12 ENCOUNTER — Encounter (HOSPITAL_COMMUNITY): Payer: Self-pay | Admitting: Internal Medicine

## 2020-05-12 LAB — CBC
HCT: 36.3 % (ref 36.0–46.0)
Hemoglobin: 10.4 g/dL — ABNORMAL LOW (ref 12.0–15.0)
MCH: 22.7 pg — ABNORMAL LOW (ref 26.0–34.0)
MCHC: 28.7 g/dL — ABNORMAL LOW (ref 30.0–36.0)
MCV: 79.1 fL — ABNORMAL LOW (ref 80.0–100.0)
Platelets: 312 10*3/uL (ref 150–400)
RBC: 4.59 MIL/uL (ref 3.87–5.11)
RDW: 19.7 % — ABNORMAL HIGH (ref 11.5–15.5)
WBC: 21.5 10*3/uL — ABNORMAL HIGH (ref 4.0–10.5)
nRBC: 0.4 % — ABNORMAL HIGH (ref 0.0–0.2)

## 2020-05-12 LAB — BASIC METABOLIC PANEL
Anion gap: 14 (ref 5–15)
BUN: 49 mg/dL — ABNORMAL HIGH (ref 8–23)
CO2: 20 mmol/L — ABNORMAL LOW (ref 22–32)
Calcium: 8 mg/dL — ABNORMAL LOW (ref 8.9–10.3)
Chloride: 118 mmol/L — ABNORMAL HIGH (ref 98–111)
Creatinine, Ser: 3.54 mg/dL — ABNORMAL HIGH (ref 0.44–1.00)
GFR calc Af Amer: 13 mL/min — ABNORMAL LOW (ref >60)
GFR calc non Af Amer: 11 mL/min — ABNORMAL LOW (ref >60)
Glucose, Bld: 242 mg/dL — ABNORMAL HIGH (ref 70–99)
Potassium: 4.4 mmol/L (ref 3.5–5.1)
Sodium: 152 mmol/L — ABNORMAL HIGH (ref 135–145)

## 2020-05-12 LAB — GLUCOSE, CAPILLARY
Glucose-Capillary: 200 mg/dL — ABNORMAL HIGH (ref 70–99)
Glucose-Capillary: 166 mg/dL — ABNORMAL HIGH (ref 70–99)
Glucose-Capillary: 263 mg/dL — ABNORMAL HIGH (ref 70–99)
Glucose-Capillary: 132 mg/dL — ABNORMAL HIGH (ref 70–99)

## 2020-05-12 MED ORDER — LACTATED RINGERS IV BOLUS
500.0000 mL | Freq: Once | INTRAVENOUS | Status: AC
  Administered 2020-05-12: 500 mL via INTRAVENOUS

## 2020-05-12 MED ORDER — PANTOPRAZOLE SODIUM 40 MG IV SOLR
40.0000 mg | Freq: Every day | INTRAVENOUS | Status: DC
  Administered 2020-05-12: 40 mg via INTRAVENOUS
  Filled 2020-05-12: qty 40

## 2020-05-12 MED ORDER — POLYETHYLENE GLYCOL 3350 17 G PO PACK
17.0000 g | PACK | Freq: Every day | ORAL | Status: DC
  Administered 2020-05-13 – 2020-05-17 (×5): 17 g via ORAL
  Filled 2020-05-12 (×5): qty 1

## 2020-05-12 MED ORDER — SODIUM CHLORIDE 0.9 % IV SOLN
3.0000 g | INTRAVENOUS | Status: DC
  Administered 2020-05-12 – 2020-05-15 (×4): 3 g via INTRAVENOUS
  Filled 2020-05-12 (×2): qty 3
  Filled 2020-05-12: qty 8
  Filled 2020-05-12: qty 0.1

## 2020-05-12 MED ORDER — CHLORHEXIDINE GLUCONATE 0.12 % MT SOLN
15.0000 mL | Freq: Two times a day (BID) | OROMUCOSAL | Status: DC
  Administered 2020-05-12 – 2020-05-17 (×7): 15 mL via OROMUCOSAL
  Filled 2020-05-12 (×9): qty 15

## 2020-05-12 MED ORDER — INSULIN ASPART 100 UNIT/ML ~~LOC~~ SOLN
0.0000 [IU] | Freq: Three times a day (TID) | SUBCUTANEOUS | Status: DC
  Administered 2020-05-12 (×2): 3 [IU] via SUBCUTANEOUS
  Administered 2020-05-12: 8 [IU] via SUBCUTANEOUS
  Administered 2020-05-13 – 2020-05-14 (×3): 2 [IU] via SUBCUTANEOUS
  Administered 2020-05-14: 11 [IU] via SUBCUTANEOUS
  Administered 2020-05-14 – 2020-05-15 (×2): 3 [IU] via SUBCUTANEOUS
  Administered 2020-05-15 (×2): 2 [IU] via SUBCUTANEOUS
  Administered 2020-05-16: 3 [IU] via SUBCUTANEOUS
  Administered 2020-05-16: 5 [IU] via SUBCUTANEOUS
  Administered 2020-05-17 (×2): 3 [IU] via SUBCUTANEOUS

## 2020-05-12 MED ORDER — RESOURCE THICKENUP CLEAR PO POWD
ORAL | Status: DC | PRN
  Filled 2020-05-12: qty 125

## 2020-05-12 MED ORDER — LABETALOL HCL 5 MG/ML IV SOLN: 5.0000 mg | INTRAVENOUS | Status: DC | PRN

## 2020-05-12 MED ORDER — LACTATED RINGERS IV SOLN: INTRAVENOUS | Status: AC

## 2020-05-12 MED ORDER — ORAL CARE MOUTH RINSE
15.0000 mL | Freq: Two times a day (BID) | OROMUCOSAL | Status: DC
  Administered 2020-05-14 – 2020-05-17 (×5): 15 mL via OROMUCOSAL

## 2020-05-12 MED ORDER — BISACODYL 10 MG RE SUPP: 10.0000 mg | Freq: Every day | RECTAL | Status: DC | PRN

## 2020-05-12 MED ORDER — METOPROLOL TARTRATE 12.5 MG HALF TABLET
12.5000 mg | ORAL_TABLET | Freq: Two times a day (BID) | ORAL | Status: DC
  Administered 2020-05-13 – 2020-05-17 (×9): 12.5 mg via ORAL
  Filled 2020-05-12 (×10): qty 1

## 2020-05-12 NOTE — Evaluation (Signed)
Clinical/Bedside Swallow Evaluation Patient Details  Name: Tiffany Bridges MRN: [id number] Date of Birth: 03-15-1934  Today's Date: 05/12/2020 Time: SLP Start Time (ACUTE ONLY): 1032 SLP Stop Time (ACUTE ONLY): 1045 SLP Time Calculation (min) (ACUTE ONLY): 13 min  Past Medical History:  Past Medical History:  Diagnosis Date  . (HFpEF) heart failure with preserved ejection fraction ([place])   . Anemia, iron deficiency   . CKD (chronic kidney disease) stage 3, GFR 30-59 ml/min   . Diabetes [facility])    Past Surgical History: History reviewed. No pertinent surgical history. HPI:  Ms. Tiffany Bridges is a 86 y/o female with a PMH of intellectual disability, dysphagia, diastolic heart failure, N4OE, AKI, and iron deficiency anemia presents to the [facility] with concerns for possible sepsis secondary to Aspiration pneumonia. [facility] recently for upper GI bleed and developed aspiration pneumonia. Referred to ED for sepsis due to aspiration pna. MBS at [facility] 2019 documented aspiration of thin during MBS in 2018 and rec'd nectar and pt reported not using thickener. MBS 01/06/18 showedmild pharyngeal residue without penetration or aspiration and thin recommended. Chest CT 05/10/20 Multifocal airspace opacities involving both upper lobes and the right lower lobe concerning for multifocal pneumonia   Assessment / Plan / Recommendation Clinical Impression  Pt is at increased risk for poor nutrition and decreased safety and has a history of aspiration. She appears cachectic, needs significant and multiple requests to accept po's. Kept eyes tightly closed throughout, answered questions with head gestures but would not phonate on request. One teaspoon, 2 cups sips and one small bite pudding consumed. Immediate cough with cup sip water and seemed to have poor control and containment with anterior spill and suspected posterior. Therapist asked if she would drink thick liquids and initially shook head no but nodded yes at end of eval.  Pt is not going to accept po's to meet her nutritional needs in addition to aspiraiton risk from pharyngeal standpoint and from oral bacterial load due to decaying lower dentition. Palliative care recommended for pt goals.       SLP Visit Diagnosis: Dysphagia, unspecified (R13.10)    Aspiration Risk  Moderate aspiration risk;Severe aspiration risk    Diet Recommendation Dysphagia 1 (Puree);Nectar-thick liquid   Liquid Administration via: Cup;Straw Medication Administration: Crushed with puree Supervision: Staff to assist with self feeding;Full supervision/cueing for compensatory strategies Compensations: Minimize environmental distractions;Slow rate;Small sips/bites Postural Changes: Seated upright at 90 degrees    Other  Recommendations Oral Care Recommendations: Oral care QID Other Recommendations: Order thickener from pharmacy   Follow up Recommendations Skilled Nursing facility      Frequency and Duration min 1 x/week  2 weeks       Prognosis Prognosis for Safe Diet Advancement: Fair (fair-guarded) Barriers to Reach Goals: Motivation;Behavior      Swallow Study   General HPI: Ms. Tiffany Bridges is a 86 y/o female with a PMH of intellectual disability, dysphagia, diastolic heart failure, V0JJ, AKI, and iron deficiency anemia presents to the [facility] with concerns for possible sepsis secondary to Aspiration pneumonia. [facility] recently for upper GI bleed and developed aspiration pneumonia. Referred to ED for sepsis due to aspiration pna. MBS at [facility] 2019 documented aspiration of thin during MBS in 2018 and rec'd nectar and pt reported not using thickener. MBS 01/06/18 showedmild pharyngeal residue without penetration or aspiration and thin recommended. Chest CT 05/10/20 Multifocal airspace opacities involving both upper lobes and the right lower lobe concerning for multifocal pneumonia Type of Study: Bedside Swallow Evaluation Previous Swallow Assessment:  (  see HPI) Diet Prior to this Study:  Dysphagia 1 (puree);Nectar-thick liquids Temperature Spikes Noted: No Respiratory Status: Nasal cannula History of Recent Intubation: No Behavior/Cognition: Other (Comment);Requires cueing (awake, kepts eyes closed) Oral Cavity Assessment: Dry (quick inspection of anterior tongue) Oral Care Completed by SLP: No Oral Cavity - Dentition: Poor condition;Missing dentition (decay observed ) Vision:  (?) Self-Feeding Abilities: Total assist (due to willingness) Patient Positioning: Upright in bed Baseline Vocal Quality: Other (comment) (did not attempt to phonate on command during eval) Volitional Cough:  (no response) Volitional Swallow: Unable to elicit    Oral/Motor/Sensory Function Overall Oral Motor/Sensory Function: Other (comment) (open mouth only slightly)   Ice Chips Ice chips: Not tested   Thin Liquid Thin Liquid: Impaired Presentation: Spoon;Cup Oral Phase Impairments: Reduced labial seal;Reduced lingual movement/coordination (poor control) Oral Phase Functional Implications: Left anterior spillage;Right anterior spillage Pharyngeal  Phase Impairments: Cough - Immediate;Decreased hyoid-laryngeal movement    Nectar Thick Nectar Thick Liquid: Not tested   Honey Thick Honey Thick Liquid: Not tested   Puree Puree: Impaired Presentation: Spoon Oral Phase Impairments: Reduced labial seal Oral Phase Functional Implications:  (labial residue) Pharyngeal Phase Impairments:  (none)   Solid     Solid: Not tested      Houston Siren 05/12/2020,11:16 AM

## 2020-05-12 NOTE — Progress Notes (Signed)
Pharmacy Antibiotic Note  Tiffany Bridges is a 86 y.o. female admitted on 05/10/2020 with pneumonia and sepsis.  Pharmacy has been consulted to change vanc and cefepime to Unasyn.  Pt w/ AKI.  Plan: Unasyn 3g IV Q24H.  Height: 5\' 6"  (167.6 cm) Weight: 40.2 kg (88 lb 10 oz) IBW/kg (Calculated) : 59.3  Temp (24hrs), Avg:98.1 F (36.7 C), Min:98 F (36.7 C), Max:98.3 F (36.8 C)  Recent Labs  Lab 05/10/20 1335 05/10/20 1554 05/11/20 0014 05/11/20 0436 05/12/20 0428  WBC 18.1*  --   --  18.1* 21.5*  CREATININE 4.41*  --  3.54* 3.55* 3.54*  LATICACIDVEN 4.9* 2.4* 1.4  --   --     Estimated Creatinine Clearance: 7.5 mL/min (A) (by C-G formula based on SCr of 3.54 mg/dL (H)).    No Known Allergies   Thank you for allowing pharmacy to be a part of this patient's care.  Wynona Neat, PharmD, BCPS  05/12/2020 7:40 AM

## 2020-05-12 NOTE — Progress Notes (Signed)
Occupational Therapy Evaluation Patient Details Name: Tiffany Bridges MRN: [id number] DOB: [birth year] Today's Date: 05/12/2020    History of Present Illness In brief, patient is an 86 year old female with a past medical history of intellectual disability, chronic diastolic heart failure, type 2 diabetes, CKD stage IIIa, iron deficiency anemia who presented to the ED with hypoxia and [facility].   Clinical Impression   PTA, pt lives with sister. Previously, pt was able to ambulate with HHA in the home and received min A from caregivers for ADLs, but has had a significant decline recently and unable to mobilize safely. [facility] with [facility]. Presently, pt with severe deficits in strength, endurance, and balance along with baseline cognitive deficits. Pt overall min guard to Mod A for bed mobility, Max A for sit to stand transfers at bedside for changing of linens due to incontinence. Pt Total A for cleanup after incontinence. However, pt did demonstrate ability to follow some one step commands with washing face at Mod A bed level. OT contacted pt's sister, who reports unable to provide this level of physical assistance for pt at home. If [facility] can expand services to assist with pt physically at home, pt's sister interested in this option. If extensive physical support not able to be provided at home 24/7, recommend SNF. Will continue to follow acutely.     Follow Up Recommendations  Supervision/Assistance - 24 hour (24/7 physical assistance at home vs SNF)    Equipment Recommendations  Other (comment) (TBD)    Recommendations for Other Services       Precautions / Restrictions Precautions Precautions: Fall;Other (comment) Precaution Comments: modified diet Restrictions Weight Bearing Restrictions: No      Mobility Bed Mobility Overal bed mobility: Needs Assistance Bed Mobility: Rolling;Sidelying to Sit;Sit to Sidelying Rolling: Min assist Sidelying to sit: Mod  assist     Sit to sidelying: Min guard General bed mobility comments: With frequent cues and encouragement, pt able to demo improved ability to complete bed mobility, but required assistance to advance B LEs to EOB due to weakness, tactile cues for pulling with bedrail  Transfers Overall transfer level: Needs assistance Equipment used: None;Rolling walker (2 wheeled) Transfers: Sit to/from Stand Sit to Stand: Max assist         General transfer comment: trialed with and without RW at Max A. Pt required assistance to maintain balance in standing and only able to stand for < 1 minute    Balance Overall balance assessment: Needs assistance Sitting-balance support: No upper extremity supported;Feet unsupported Sitting balance-Leahy Scale: Good Sitting balance - Comments: able to statically sit with close supervision   Standing balance support: Bilateral upper extremity supported Standing balance-Leahy Scale: Poor Standing balance comment: Mod A to Max A to maintain standing balance                           ADL either performed or assessed with clinical judgement   ADL Overall ADL's : Needs assistance/impaired Eating/Feeding: Maximal assistance;Sitting   Grooming: Moderate assistance;Bed level;Wash/dry face Grooming Details (indicate cue type and reason): Pt required assistance for thoroughness but demo ability to follow one step commands Upper Body Bathing: Maximal assistance;Bed level   Lower Body Bathing: Total assistance;Bed level   Upper Body Dressing : Maximal assistance;Bed level Upper Body Dressing Details (indicate cue type and reason): Max A to doff/don gown Lower Body Dressing: Total assistance;Bed level   Toilet Transfer: Total assistance  Toileting- Clothing Manipulation and Hygiene: Total assistance;Bed level Toileting - Clothing Manipulation Details (indicate cue type and reason): Total A after incontinence of urine/bowels     Functional  mobility during ADLs: Total assistance General ADL Comments: Max A to Total A for all ADLs, severe deficits in strength, endurance, and limited participation due to intellectual deficits     Vision   Vision Assessment?: No apparent visual deficits     Perception     Praxis      Pertinent Vitals/Pain Pain Assessment: Faces Faces Pain Scale: Hurts a little bit Pain Location: pt reponded "yes" to pain, but unable to vocalize location Pain Descriptors / Indicators: Grimacing Pain Intervention(s): Limited activity within patient's tolerance;Monitored during session     Hand Dominance Right   Extremity/Trunk Assessment Upper Extremity Assessment Upper Extremity Assessment: Difficult to assess due to impaired cognition   Lower Extremity Assessment Lower Extremity Assessment: Defer to PT evaluation   Cervical / Trunk Assessment Cervical / Trunk Assessment: Kyphotic   Communication Communication Communication: Expressive difficulties   Cognition Arousal/Alertness: Lethargic Behavior During Therapy: Flat affect Overall Cognitive Status: History of cognitive impairments - at baseline                                 General Comments: pt with eyes closed through session, difficult to arouse initially. pt then with minimal responses to questions other than nods. when family arrived they reported that she seems similar in affect to how she is at home stating she is "not a talkative person". Pt seems to be poor historian, but is limited by inconsistent use of nodding   General Comments       Exercises     Shoulder Instructions      Home Living Family/patient expects to be discharged to:: Private residence Living Arrangements: Other relatives (sister, aide comes in every day at [facility], pt attends [facility] ) Available Help at Discharge: Family;Personal care attendant;Other (Comment) ([facility]) Type of Home: House Home Access: Level entry     Home Layout: One  level     Bathroom Shower/Tub: Teacher, early years/pre: Standard     Home Equipment: Hand held shower head;Shower seat;Grab bars - tub/shower          Prior Functioning/Environment Level of Independence: Needs assistance  Gait / Transfers Assistance Needed: until last 2 weeks, family reports the pt was able to walk with [facility]. lately pt has fallen multiple times and needs sig assist to ambulate, used transport chair at [facility] ADL's / Homemaking Assistance Needed: sister completes all home care. aide comes daily to assist with dressing and shower Communication / Swallowing Assistance Needed: per family, pt is not talkative, mostly relies on nods Comments: PLOF obtained from PT and family         OT Problem List: Decreased strength;Decreased activity tolerance;Impaired balance (sitting and/or standing);Decreased cognition;Decreased safety awareness      OT Treatment/Interventions: Self-care/ADL training;Therapeutic exercise;Energy conservation;Therapeutic activities;Patient/family education    OT Goals(Current goals can be found in the care plan section) Acute Rehab OT Goals Patient Stated Goal: back to bed OT Goal Formulation: Patient unable to participate in goal setting Time For Goal Achievement: 05/26/20 Potential to Achieve Goals: Fair ADL Goals Pt Will Perform Eating: with min assist;sitting Pt Will Perform Grooming: with min assist;sitting Pt Will Perform Upper Body Bathing: with mod assist;bed level Additional ADL Goal #1: Pt to demonstrate ability  to complete sit to stand transfer at [place] A to decrease caregiver burden and prep for ADL transfers Additional ADL Goal #2: Pt to demonstrate ability to follow one step commands 80% of the time during daily tasks.  OT Frequency: Min 2X/week   Barriers to D/C:            Co-evaluation              AM-PAC OT "6 Clicks" Daily Activity     Outcome Measure Help from another person eating meals?: A Lot Help from  another person taking care of personal grooming?: A Lot Help from another person toileting, which includes using toliet, bedpan, or urinal?: Total Help from another person bathing (including washing, rinsing, drying)?: A Lot Help from another person to put on and taking off regular upper body clothing?: A Lot Help from another person to put on and taking off regular lower body clothing?: Total 6 Click Score: 10   End of Session Equipment Utilized During Treatment: Gait belt Nurse Communication: Mobility status  Activity Tolerance: Patient limited by lethargy;Other (comment) (limited by cognition) Patient left: in bed;with call bell/phone within reach;with bed alarm set  OT Visit Diagnosis: Unsteadiness on feet (R26.81);Other abnormalities of gait and mobility (R26.89);Muscle weakness (generalized) (M62.81);Other symptoms and signs involving cognitive function                Time: 1320-1353 OT Time Calculation (min): 33 min Charges:  OT General Charges $OT Visit: 1 Visit OT Evaluation $OT Eval Moderate Complexity: 1 Mod OT Treatments $Self Care/Home Management : 8-22 mins  Layla Maw, OTR/L  Layla Maw 05/12/2020, 3:27 PM

## 2020-05-12 NOTE — Evaluation (Signed)
Physical Therapy Evaluation Patient Details Name: Tiffany Bridges MRN: [id number] DOB: July 14, 1934 Today's Date: 05/12/2020   History of Present Illness  In brief, patient is an 86 year old female with a past medical history of intellectual disability, chronic diastolic heart failure, type 2 diabetes, CKD stage IIIa, iron deficiency anemia who presented to the ED with hypoxia and [facility].  Clinical Impression  Pt in bed upon arrival of PT. Prior to admission the pt was able to ambulate with HHA from caregivers and received minA for ADLs such as dressing and bathing from aide. However, family reports the pt has had sig decline in mobility over past few weeks resulting in increased falls at home. The pt now presents with limitations in functional mobility, strength, power, stability, and activity tolerance due to above dx and chronically decreased activity, and will continue to benefit from skilled PT to address these deficits. The pt was lethargic during today's session and maintained eyes closed throughout session. The pt was able to transition to sitting EOB but immediately requested return to supine due to fatigue. Otherwise, the pt communicated only through nodding and required modA to complete transition to EOB and minA to maintain seated position. PT returned when family present and the pt was more engaged with eyes open and eating when fed by sister. The pt will continue to benefit from skilled PT acutely and following d/c to further progress safety with functional transfers and to reduce caregiver burden.      Follow Up Recommendations Other (comment) (based on significantly improved pt interaction with family vs PT alone, return [facility] for therapies and home will be the best environment for her if family is able to provide needed assist, if not may need SNF.)    Equipment Recommendations  Wheelchair (measurements PT);Wheelchair cushion (measurements PT)     Recommendations for Other Services       Precautions / Restrictions Precautions Precautions: Fall Restrictions Weight Bearing Restrictions: No      Mobility  Bed Mobility Overal bed mobility: Needs Assistance Bed Mobility: Rolling;Sidelying to Sit;Sit to Sidelying Rolling: Mod assist Sidelying to sit: Mod assist     Sit to sidelying: Mod assist General bed mobility comments: modA for bed mobilty as pt with minimal assistance today, able to initiate repositioning to return to supine, not resistive to movement  Transfers Overall transfer level:  (unsafe at this time, pt asking to return to supine)                      Balance Overall balance assessment: Needs assistance Sitting-balance support: No upper extremity supported;Feet unsupported Sitting balance-Leahy Scale: Fair Sitting balance - Comments: pt requires minA to maintain seated                                     Pertinent Vitals/Pain Pain Assessment: Faces Faces Pain Scale: Hurts a little bit Pain Location: pt maintained eyes closed, reports pain but unable to verbalize or show what is hurting Pain Descriptors / Indicators: Grimacing Pain Intervention(s): Limited activity within patient's tolerance;Monitored during session    Home Living Family/patient expects to be discharged to:: Private residence Living Arrangements: Other relatives (sister, aide comes every day at 4pm, [facility] day) Available Help at Discharge: Family;Personal care attendant;Other (Comment) ([facility]) Type of Home: House Home Access: Level entry  Home Layout: One level Home Equipment: Hand held shower head;Shower seat;Grab bars - tub/shower      Prior Function Level of Independence: Needs assistance   Gait / Transfers Assistance Needed: until last 2 weeks, family reports the pt was able to walk with HHA. lately pt has fallen multiple times and needs sig assist to  ambulate, used transport chair at [facility]  ADL's / Homemaking Assistance Needed: sister completes all home care. aide comes daily to assist with dressing and shower  Comments: info from sister and aide who were present. when originally asking pt she nodded yes to use of WC but was unable to provide any other information     Hand Dominance        Extremity/Trunk Assessment   Upper Extremity Assessment Upper Extremity Assessment: Defer to OT evaluation;Generalized weakness    Lower Extremity Assessment Lower Extremity Assessment: Generalized weakness    Cervical / Trunk Assessment Cervical / Trunk Assessment: Kyphotic  Communication   Communication: Expressive difficulties  Cognition Arousal/Alertness: Lethargic Behavior During Therapy: Flat affect Overall Cognitive Status: No family/caregiver present to determine baseline cognitive functioning                                 General Comments: pt with eyes closed through session, difficult to arouse initially. pt then with minimal responses to questions other than nods. when family arrived they reported that she seems similar in affect to how she is at home stating she is "not a talkative person". Pt seems to be poor historian, but is limited by inconsistent use of nodding             Assessment/Plan    PT Assessment Patient needs continued PT services  PT Problem List Decreased strength;Decreased mobility;Decreased safety awareness;Decreased knowledge of precautions;Decreased activity tolerance;Decreased cognition;Decreased balance;Pain       PT Treatment Interventions DME instruction;Gait training;Therapeutic activities;Functional mobility training;Patient/family education;Balance training;Therapeutic exercise    PT Goals (Current goals can be found in the Care Plan section)  Acute Rehab PT Goals Patient Stated Goal: go back to sleep PT Goal Formulation: With patient Time For Goal Achievement:  05/26/20 Potential to Achieve Goals: Good    Frequency Min 2X/week   Barriers to discharge Decreased caregiver support pt sister may not be able to provide level of assist pt currently needs       AM-PAC PT "6 Clicks" Mobility  Outcome Measure Help needed turning from your back to your side while in a flat bed without using bedrails?: A Lot Help needed moving from lying on your back to sitting on the side of a flat bed without using bedrails?: A Lot Help needed moving to and from a bed to a chair (including a wheelchair)?: Total Help needed standing up from a chair using your arms (e.g., wheelchair or bedside chair)?: Total Help needed to walk in hospital room?: Total Help needed climbing 3-5 steps with a railing? : Total 6 Click Score: 8    End of Session Equipment Utilized During Treatment:  (none) Activity Tolerance: Patient limited by lethargy;Patient limited by fatigue Patient left: in bed;with call bell/phone within reach Nurse Communication: Mobility status PT Visit Diagnosis: Repeated falls (R29.6);Muscle weakness (generalized) (M62.81);Difficulty in walking, not elsewhere classified (R26.2)    Time: 1051-1105 PT Time Calculation (min) (ACUTE ONLY): 14 min   Charges:   PT Evaluation $PT Eval Moderate Complexity: 1 Mod  Karma Ganja, PT, DPT   Acute Rehabilitation Department Pager #: [phone]  Otho Bellows 05/12/2020, 1:15 PM

## 2020-05-12 NOTE — Progress Notes (Signed)
° °  Subjective:   Patient states that she is not feeling well but has a difficult time answering questions. She states she is tired. She denies shortness of breath, nausea or abdominal pain. She will not answer orientation questions.   Objective:  Vital signs in last 24 hours: Vitals:   05/11/20 1855 05/11/20 2234 05/12/20 0255 05/12/20 0541  BP: (!) 139/57 (!) 145/59 138/67   Pulse: 86 87 89   Resp: 16 18 18    Temp:  98 F (36.7 C) 98 F (36.7 C)   TempSrc:      SpO2: 94% 98% 98%   Weight:  40.2 kg    Height:    5\' 6"  (1.676 m)   Constitution: underweight, NAD  Cardio: RRR, no m/r/g, no LE edema  Respiratory: CTA, no w/r/r Neuro: normal affect, difficult to arouse Skin: c/d/i   Assessment/Plan:  Active Problems:   Acute renal failure (ARF) ([place])  86 yo female with a PMH of intellectual disability, HFpEF, IDA, TIIDM, AKI, upper GI bleed, who presented with possible sepsis likely secondary to aspiration pneumonia with acute renal failure after recent admission at [facility] for anemia and upper GI bleed.   Multifocal Pneumonia Presented with hypoxia in setting of recent aspiration pneumonia and was found to have bilateral infiltrates in the upper lobes bilaterally on CT in addition to some pulmonary edema. She is now on room air with good O2 sats and no longer tachypnic. Leukocytosis continues to be elevated.   - switch cefepime to unasyn - BCx NGTD <24 hours, will f/u blood cultures  - gentle fluids with recent CT showing pulmonary edema, overall appears volume down, has prerenal aki and is not taking anything by mouth today - low threshhold to d/c fluids as this has had to be done during previous hospital admissions. Also had elevate BNP during stay at [facility]  - f/u PT/OT  Acute Renal Failure on CKD Stage III Hypernatremia Cr .1.57 after recent discharge from [facility] and 4.5 during admission here, likely prerenal in setting of sepsis, FeNa .7%, and hypernatremia. UA with  small amount of hemoglobin, proteinuria, leukocytosis and small amount bacteria. Renal US showed possible mass vs. debris in the bladder without hydronephrosis and right septated cysts    - will discuss US findings with family due to patient's intellectual disability and limited interaction.  - urine culture w/small amount of yeast, no growth - cont. Gentle fluids - afternoon BMP   Type II DM  - SSI increased to moderate. Close monitoring with decreased po intake   Iron Deficiency Anemia PUD Admitted to [facility] with recent GI bleed, found to have 1cm ulcer in posterior duodenal bulb 04/27/20. Hemoglobin stable   - cont PPI bid - restart iron once infection resolves  HTN - switch toprol xr to to metoprolol so that meds can be crushed with dysphagia 1 diet.  - may need IV prns with not wanting to take anything by mouth  VTE: heparin IVF: 100cc/hr Diet: dysphagia 1 Code: full   Dispo: Anticipated discharge in pending medical work-up.   Keyoni Lapinski A, DO 05/12/2020, 6:33 AM Pager: [phone] After 5pm on weekdays and 1pm on weekends: On Call Pager: [phone]

## 2020-05-13 ENCOUNTER — Encounter (HOSPITAL_COMMUNITY): Payer: Self-pay | Admitting: Internal Medicine

## 2020-05-13 DIAGNOSIS — E119 Type 2 diabetes mellitus without complications: Secondary | ICD-10-CM

## 2020-05-13 DIAGNOSIS — D509 Iron deficiency anemia, unspecified: Secondary | ICD-10-CM

## 2020-05-13 DIAGNOSIS — E870 Hyperosmolality and hypernatremia: Secondary | ICD-10-CM

## 2020-05-13 DIAGNOSIS — I129 Hypertensive chronic kidney disease with stage 1 through stage 4 chronic kidney disease, or unspecified chronic kidney disease: Secondary | ICD-10-CM

## 2020-05-13 DIAGNOSIS — J690 Pneumonitis due to inhalation of food and vomit: Secondary | ICD-10-CM

## 2020-05-13 DIAGNOSIS — N179 Acute kidney failure, unspecified: Secondary | ICD-10-CM

## 2020-05-13 DIAGNOSIS — N183 Chronic kidney disease, stage 3 unspecified: Secondary | ICD-10-CM

## 2020-05-13 LAB — CBC WITH DIFFERENTIAL/PLATELET
Abs Immature Granulocytes: 0.32 10*3/uL — ABNORMAL HIGH (ref 0.00–0.07)
Basophils Absolute: 0 10*3/uL (ref 0.0–0.1)
Basophils Relative: 0 %
Eosinophils Absolute: 0.1 10*3/uL (ref 0.0–0.5)
Eosinophils Relative: 0 %
HCT: 33.5 % — ABNORMAL LOW (ref 36.0–46.0)
Hemoglobin: 9.9 g/dL — ABNORMAL LOW (ref 12.0–15.0)
Immature Granulocytes: 1 %
Lymphocytes Relative: 4 %
Lymphs Abs: 1 10*3/uL (ref 0.7–4.0)
MCH: 22.9 pg — ABNORMAL LOW (ref 26.0–34.0)
MCHC: 29.6 g/dL — ABNORMAL LOW (ref 30.0–36.0)
MCV: 77.5 fL — ABNORMAL LOW (ref 80.0–100.0)
Monocytes Absolute: 0.6 10*3/uL (ref 0.1–1.0)
Monocytes Relative: 3 %
Neutro Abs: 20.5 10*3/uL — ABNORMAL HIGH (ref 1.7–7.7)
Neutrophils Relative %: 92 %
Platelets: 313 10*3/uL (ref 150–400)
RBC: 4.32 MIL/uL (ref 3.87–5.11)
RDW: 19.5 % — ABNORMAL HIGH (ref 11.5–15.5)
WBC: 22.5 10*3/uL — ABNORMAL HIGH (ref 4.0–10.5)
nRBC: 0.1 % (ref 0.0–0.2)

## 2020-05-13 LAB — GLUCOSE, CAPILLARY
Glucose-Capillary: 141 mg/dL — ABNORMAL HIGH (ref 70–99)
Glucose-Capillary: 112 mg/dL — ABNORMAL HIGH (ref 70–99)
Glucose-Capillary: 125 mg/dL — ABNORMAL HIGH (ref 70–99)
Glucose-Capillary: 129 mg/dL — ABNORMAL HIGH (ref 70–99)

## 2020-05-13 LAB — BASIC METABOLIC PANEL
Anion gap: 13 (ref 5–15)
BUN: 45 mg/dL — ABNORMAL HIGH (ref 8–23)
CO2: 23 mmol/L (ref 22–32)
Calcium: 8.2 mg/dL — ABNORMAL LOW (ref 8.9–10.3)
Chloride: 119 mmol/L — ABNORMAL HIGH (ref 98–111)
Creatinine, Ser: 3.13 mg/dL — ABNORMAL HIGH (ref 0.44–1.00)
GFR calc Af Amer: 15 mL/min — ABNORMAL LOW (ref >60)
GFR calc non Af Amer: 13 mL/min — ABNORMAL LOW (ref >60)
Glucose, Bld: 113 mg/dL — ABNORMAL HIGH (ref 70–99)
Potassium: 3.8 mmol/L (ref 3.5–5.1)
Sodium: 155 mmol/L — ABNORMAL HIGH (ref 135–145)

## 2020-05-13 MED ORDER — DEXTROSE 5 % IV SOLN: INTRAVENOUS | Status: AC

## 2020-05-13 MED ORDER — PANTOPRAZOLE SODIUM 40 MG PO TBEC
40.0000 mg | DELAYED_RELEASE_TABLET | Freq: Two times a day (BID) | ORAL | Status: DC
  Administered 2020-05-13 – 2020-05-17 (×9): 40 mg via ORAL
  Filled 2020-05-13 (×9): qty 1

## 2020-05-13 MED ORDER — DEXTROSE 5 % IV SOLN: INTRAVENOUS | Status: DC

## 2020-05-13 NOTE — Plan of Care (Signed)
  Problem: Safety: Goal: Ability to remain free from injury will improve Outcome: Progressing   

## 2020-05-13 NOTE — Progress Notes (Signed)
   Subjective:   She is more energetic on rounds this morning. She denies pain or shortness of breath. She hasn't been very hungry and doesn't want any more food.   Objective:  Vital signs in last 24 hours: Vitals:   05/12/20 0908 05/12/20 1624 05/12/20 2101 05/13/20 0502  BP: (!) 136/59 139/67 (!) 134/54 (!) 155/62  Pulse: 83 86 89 87  Resp: 16 18 20 20   Temp: 98.2 F (36.8 C) 98.2 F (36.8 C) 98.8 F (37.1 C) 98.3 F (36.8 C)  TempSrc: Oral Oral Oral Oral  SpO2: 98% 93% 97% (!) 83%  Weight:      Height:        Constitution: NAD, appears stated age Cardio: RRR, no m/r/g, no LE edema  Respiratory: CTA, no w/r/r Neuro: pleasant, answering some questions Skin: c/d/i   Assessment/Plan:  Active Problems:   Acute renal failure (ARF) ([place])  86yo female with PMH of intellectual disability, HFpEF, IDA, TIIDM, AKI, upper GI bleed who presented with recurrence of aspiration pneumonia with acute renal injury on chronic kidney disease after recent admission at [facility] for anemia and upper GI bleed.   Multifocal Aspiration Pneumonia Have not been able to draw labs yet today due to patient refusing. Discussed with sister and nursing and they will have phlebotomy come by again. Otherwise breathing well and improving on unasyn. She appears better today.  Speech consulted and recommending dysphagia 1 diet in a addition to palliative care consult for decreased oral intake and discussion of goals. Discussed with her PCP Dr. Oris Drone, and received notes from when she was admitted at [facility] and palliative was consulted. While it appears code status was addressed, goals were not discussed and I think palliative would be appropriate to help the family move forward in decision making as Jessa's independence has decreased significantly in recent months.   - cont. unasyn  - NG 3 days - TOC consulted for SNF vs. Return home with assistance from [facility]  - palliative consult for goals of care    Acute Renal Failure on CKD Stage III Hypernatremia  Received additional fluids yesterday with improvement in symptoms today.   - f/u BMP - will attempt again to draw labs   Type II DM  Cont. SSI moderate  IDA PUD - cont. ppi  - restart Fe once infection resolved   HTN Cont. Metop bid as this can be crushed with dysphagia diet   VTE: heparin  IVF: none Diet: dysphagia 1 Code: full   Dispo: Anticipated discharge pending medical work-up.   Marty Heck, DO 05/13/2020, 6:24 AM [phone]

## 2020-05-14 ENCOUNTER — Encounter (HOSPITAL_COMMUNITY): Payer: Self-pay | Admitting: Internal Medicine

## 2020-05-14 DIAGNOSIS — Z515 Encounter for palliative care: Secondary | ICD-10-CM

## 2020-05-14 DIAGNOSIS — Z7189 Other specified counseling: Secondary | ICD-10-CM

## 2020-05-14 LAB — CBC
HCT: 33.6 % — ABNORMAL LOW (ref 36.0–46.0)
Hemoglobin: 9.6 g/dL — ABNORMAL LOW (ref 12.0–15.0)
MCH: 22.6 pg — ABNORMAL LOW (ref 26.0–34.0)
MCHC: 28.6 g/dL — ABNORMAL LOW (ref 30.0–36.0)
MCV: 79.1 fL — ABNORMAL LOW (ref 80.0–100.0)
Platelets: 289 10*3/uL (ref 150–400)
RBC: 4.25 MIL/uL (ref 3.87–5.11)
RDW: 19.8 % — ABNORMAL HIGH (ref 11.5–15.5)
WBC: 15.7 10*3/uL — ABNORMAL HIGH (ref 4.0–10.5)
nRBC: 0.1 % (ref 0.0–0.2)

## 2020-05-14 LAB — BASIC METABOLIC PANEL
Anion gap: 14 (ref 5–15)
BUN: 42 mg/dL — ABNORMAL HIGH (ref 8–23)
CO2: 22 mmol/L (ref 22–32)
Calcium: 8.2 mg/dL — ABNORMAL LOW (ref 8.9–10.3)
Chloride: 115 mmol/L — ABNORMAL HIGH (ref 98–111)
Creatinine, Ser: 3 mg/dL — ABNORMAL HIGH (ref 0.44–1.00)
GFR calc Af Amer: 16 mL/min — ABNORMAL LOW (ref >60)
GFR calc non Af Amer: 14 mL/min — ABNORMAL LOW (ref >60)
Glucose, Bld: 202 mg/dL — ABNORMAL HIGH (ref 70–99)
Potassium: 3.6 mmol/L (ref 3.5–5.1)
Sodium: 151 mmol/L — ABNORMAL HIGH (ref 135–145)

## 2020-05-14 LAB — GLUCOSE, CAPILLARY
Glucose-Capillary: 188 mg/dL — ABNORMAL HIGH (ref 70–99)
Glucose-Capillary: 196 mg/dL — ABNORMAL HIGH (ref 70–99)
Glucose-Capillary: 321 mg/dL — ABNORMAL HIGH (ref 70–99)
Glucose-Capillary: 214 mg/dL — ABNORMAL HIGH (ref 70–99)

## 2020-05-14 MED ORDER — DEXTROSE 5 % IV SOLN: INTRAVENOUS | Status: DC

## 2020-05-14 MED ORDER — ADULT MULTIVITAMIN W/MINERALS CH
1.0000 | ORAL_TABLET | Freq: Every day | ORAL | Status: DC
  Administered 2020-05-14 – 2020-05-17 (×4): 1 via ORAL
  Filled 2020-05-14 (×4): qty 1

## 2020-05-14 NOTE — Progress Notes (Signed)
Spoke with Ms. Tiffany Bridges and her Nephew at bedside today. We discussed the patient's pneumonia management, electrolyte status, and post discharge planning. Per her family, Ms. Tiffany Bridges has returned to her baseline. We discussed SNF vs continuing to follow with [facility]. Due to Ms. Tiffany Bridges taking care of her sister, she would prefer if the patient would be able to ambulate before returning home, as Ms. Tiffany Bridges has had several fall episodes at Ms. Tiffany Bridges's house, and she is having difficulty caring for Ms. Tiffany Bridges. Both Ms. Tiffany Bridges and her nephew agree that SNF placement would be the best choice for continued rehabilitation.  Tiffany Mercury, MD IMTS, PGY-1 Pager: [phone] 05/14/2020,12:46 PM

## 2020-05-14 NOTE — Progress Notes (Signed)
   Subjective:  O/N Events: None  Tiffany Bridges was evaluated at bedside this morning. She answers in 1-2 words today, stating she is "fine" and "thanks." She has no complaints today. States that she is breathing fine.   Objective:  Vital signs in last 24 hours: Vitals:   05/13/20 0502 05/13/20 0908 05/13/20 1955 05/14/20 0625  BP: (!) 155/62 131/75 137/63 (!) 142/65  Pulse: 87 93 89 83  Resp: 20 18 18 16   Temp: 98.3 F (36.8 C) 98.2 F (36.8 C) 98.3 F (36.8 C) 98.4 F (36.9 C)  TempSrc: Oral  Oral Oral  SpO2: (!) 83% 95% 100% 100%  Weight:      Height:       Physical Exam Constitutional:      General: She is not in acute distress. Pulmonary:     Effort: Pulmonary effort is normal.     Breath sounds: No wheezing, rhonchi or rales.  Abdominal:     General: Abdomen is flat. Bowel sounds are normal.  Neurological:     Mental Status: She is alert.     LABS:  CBC Latest Ref Rng & Units 05/14/2020 05/13/2020 05/12/2020  WBC 4.0 - 10.5 K/uL 15.7(H) 22.5(H) 21.5(H)  Hemoglobin 12.0 - 15.0 g/dL 9.6(L) 9.9(L) 10.4(L)  Hematocrit 36 - 46 % 33.6(L) 33.5(L) 36.3  Platelets 150 - 400 K/uL 289 313 312   BMP Latest Ref Rng & Units 05/14/2020 05/13/2020 05/12/2020  Glucose 70 - 99 mg/dL 202(H) 113(H) 242(H)  BUN 8 - 23 mg/dL 42(H) 45(H) 49(H)  Creatinine 0.44 - 1.00 mg/dL 3.00(H) 3.13(H) 3.54(H)  Sodium 135 - 145 mmol/L 151(H) 155(H) 152(H)  Potassium 3.5 - 5.1 mmol/L 3.6 3.8 4.4  Chloride 98 - 111 mmol/L 115(H) 119(H) 118(H)  CO2 22 - 32 mmol/L 22 23 20(L)  Calcium 8.9 - 10.3 mg/dL 8.2(L) 8.2(L) 8.0(L)    Assessment/Plan:  Active Problems:   Acute renal failure (ARF) ([facility])   Aspiration pneumonia ([facility])   Hypernatremia  86yo female with PMH of intellectual disability, HFpEF, IDA, TIIDM, AKI, upper GI bleed who presented with recurrence of aspiration pneumonia with acute renal injury on chronic kidney disease.   Multifocal Aspiration Pneumonia: Leukocytosis resolving, 15 today  from 22. Vitals are stable. Patient is more energetic today than the last time I evaluated her on 05/11/20. Will continue antibiotic course.  - Continue Unasyn - NGTD x3 - TOC consulted for SNF vs. Return home with [facility] from [facility]  - palliative consult for goals of care   Acute Renal Failure on CKD Stage III Hypernatremia  Hypernatremia continues to improve to 151 today, with creatinine continuing to trend down as well.  - Continue IV resuscitation  - Cr: 3.00<-3.13 - Trend BMP   Type II DM  - Continue moderate SSI   IDA PUD - Continue PPI  - Will restart Fe supplementation after    HTN:  - Continue Lopressor 12.5 mg BID.   VTE: heparin  IVF: none Diet: dysphagia 1 Code: full   Dispo: Anticipated discharge pending medical work-up.   Maudie Mercury, MD 05/14/2020, 7:02 AM [phone]

## 2020-05-14 NOTE — Plan of Care (Signed)
  Problem: Safety: Goal: Ability to remain free from injury will improve Outcome: Progressing   
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
## 2020-05-14 NOTE — Progress Notes (Addendum)
Initial Nutrition Assessment  DOCUMENTATION CODES:   Underweight  INTERVENTION:    Magic cup TID with meals, each supplement provides 290 kcal and 9 grams of protein  Vital Cuisine Shake BID, each supplement provides 520 kcal and 22 grams of protein  MVI with minerals daily.  NUTRITION DIAGNOSIS:   Inadequate oral intake related to poor appetite as evidenced by meal completion < 25%.  GOAL:   Patient will meet greater than or equal to 90% of their needs  MONITOR:   PO intake, Supplement acceptance  REASON FOR ASSESSMENT:   Malnutrition Screening Tool    ASSESSMENT:   86 yo female admitted with recurrent aspiration PNA with AKI on CKD. PMH includes intellectual disability, HF, iron deficiency anemia, DM-2, AKI, GIB.  No recent weights available for review. Patient is underweight with BMI=14.3. Suspect she meets criteria for malnutrition, however, unable to obtain enough information at this time for identification of malnutrition.    Currently on a dysphagia 1 diet with nectar thick liquids. Meal intakes recorded at 0-10%.   Labs reviewed. Na 151 (H), BUN 42 (H), creat 3 (H), Hgb 9.6 (L) CBG: [phone]  Medications reviewed and include novolog, miralax.  NUTRITION - FOCUSED PHYSICAL EXAM:  unable to complete  Diet Order:   Diet Order            DIET - DYS 1 Room service appropriate? Yes; Fluid consistency: Nectar Thick  Diet effective now                 EDUCATION NEEDS:   Not appropriate for education at this time  Skin:  Skin Assessment: Reviewed RN Assessment  Last BM:  6/27  Height:   Ht Readings from Last 1 Encounters:  05/12/20 5\' 6"  (1.676 m)    Weight:   Wt Readings from Last 1 Encounters:  05/11/20 40.2 kg    Ideal Body Weight:  59.1 kg  BMI:  Body mass index is 14.3 kg/m.  Estimated Nutritional Needs:   Kcal:  1400-1600  Protein:  65-75 gm  Fluid:  >/= 1.4 L    Lucas Mallow, RD, LDN, CNSC Please refer to Amion for  contact information.

## 2020-05-15 LAB — CBC
HCT: 32.7 % — ABNORMAL LOW (ref 36.0–46.0)
Hemoglobin: 9.5 g/dL — ABNORMAL LOW (ref 12.0–15.0)
MCH: 22.9 pg — ABNORMAL LOW (ref 26.0–34.0)
MCHC: 29.1 g/dL — ABNORMAL LOW (ref 30.0–36.0)
MCV: 79 fL — ABNORMAL LOW (ref 80.0–100.0)
Platelets: 302 10*3/uL (ref 150–400)
RBC: 4.14 MIL/uL (ref 3.87–5.11)
RDW: 19.3 % — ABNORMAL HIGH (ref 11.5–15.5)
WBC: 10.8 10*3/uL — ABNORMAL HIGH (ref 4.0–10.5)
nRBC: 0.3 % — ABNORMAL HIGH (ref 0.0–0.2)

## 2020-05-15 LAB — GLUCOSE, CAPILLARY
Glucose-Capillary: 195 mg/dL — ABNORMAL HIGH (ref 70–99)
Glucose-Capillary: 140 mg/dL — ABNORMAL HIGH (ref 70–99)
Glucose-Capillary: 133 mg/dL — ABNORMAL HIGH (ref 70–99)
Glucose-Capillary: 256 mg/dL — ABNORMAL HIGH (ref 70–99)

## 2020-05-15 LAB — CULTURE, BLOOD (ROUTINE X 2): Culture: NO GROWTH

## 2020-05-15 LAB — BASIC METABOLIC PANEL
Anion gap: 12 (ref 5–15)
BUN: 33 mg/dL — ABNORMAL HIGH (ref 8–23)
CO2: 19 mmol/L — ABNORMAL LOW (ref 22–32)
Calcium: 8 mg/dL — ABNORMAL LOW (ref 8.9–10.3)
Chloride: 112 mmol/L — ABNORMAL HIGH (ref 98–111)
Creatinine, Ser: 2.29 mg/dL — ABNORMAL HIGH (ref 0.44–1.00)
GFR calc Af Amer: 22 mL/min — ABNORMAL LOW (ref >60)
GFR calc non Af Amer: 19 mL/min — ABNORMAL LOW (ref >60)
Glucose, Bld: 207 mg/dL — ABNORMAL HIGH (ref 70–99)
Potassium: 4.4 mmol/L (ref 3.5–5.1)
Sodium: 143 mmol/L (ref 135–145)

## 2020-05-15 MED ORDER — SODIUM CHLORIDE 0.9 % IV SOLN: INTRAVENOUS | Status: AC

## 2020-05-15 MED ORDER — SODIUM CHLORIDE 0.9 % IV SOLN
3.0000 g | Freq: Two times a day (BID) | INTRAVENOUS | Status: DC
  Administered 2020-05-15 – 2020-05-17 (×4): 3 g via INTRAVENOUS
  Filled 2020-05-15 (×4): qty 8
  Filled 2020-05-15 (×2): qty 3

## 2020-05-15 NOTE — Progress Notes (Signed)
Inpatient Diabetes Program Recommendations  AACE/ADA: New Consensus Statement on Inpatient Glycemic Control   Target Ranges:  Prepandial:   less than 140 mg/dL      Peak postprandial:   less than 180 mg/dL (1-2 hours)      Critically ill patients:  140 - 180 mg/dL   Results for TARRIE, MCMICHEN (MRN [id number]) as of 05/15/2020 10:05  Ref. Range 05/14/2020 07:15 05/14/2020 11:24 05/14/2020 16:20 05/14/2020 21:03 05/15/2020 06:41  Glucose-Capillary Latest Ref Range: 70 - 99 mg/dL 188 (H) 196 (H) 321 (H) 214 (H) 195 (H)   Review of Glycemic Control  Diabetes history: DM2 Outpatient Diabetes medications: Glipizide 2.5 mg QAM, Tradjenta 5 mg daily Current orders for Inpatient glycemic control: Novolog 0-15 units TID with meals  Inpatient Diabetes Program Recommendations:    Insulin-Correction: Please consider adding Novolog 0-5 units QHS for bedtime correction.  Insulin-Meal Coverage: Please consider ordering Novolog 2 units TID with meals for meal coverage if patient eats at least 50% of meals.  Thanks, Barnie Alderman, RN, MSN, CDE Diabetes Coordinator Inpatient Diabetes Program [phone] (Team Pager from 8am to 5pm)

## 2020-05-15 NOTE — Progress Notes (Signed)
Palliative: Ms. Tiffany Bridges, Tiffany Bridges is resting quietly in bed.  She appears acutely/chronically ill and frail.  She will briefly make but not keep eye contact.  She has known intellectual disabilities, but is oriented to self.  She is able to make her basic needs known.  There is no family at bedside at this time.  Tiffany Bridges is reassured that she is getting better, and we are working toward getting her strong enough to go home.  I share that I am talking with her sister and nephew, also working with [facility] for getting rehab.  Tiffany Bridges is tearful at times stating that she does not want to watch TV, or that she cannot find her shoes.  Again, she is reassured that she is safe here, and we are working toward getting her time.  Call to sister, Tiffany Bridges, she tells me that she saw Tiffany Bridges yesterday and today and feels encouraged that Tiffany Bridges is more alert, in a better mood, eating more.  We talked about her current state, disposition, CODE STATUS.  Tiffany Bridges tells me that she is with Tiffany Bridges's daughter now, and is trying to get her transferred to [facility].  Tiffany Bridges shares that if she had it to do over again she would let [name] "rest" and not put her through this.  She shares that [name] has been in pain for several months, and that "when God calls we let her [place]) go", DNR status.    We talked about disposition, and I share that our transition care team is working in the background with [facility] for rehab.  Conference with attending, bedside nursing staff, transition of care team related to patient condition, needs, goals of care, CODE STATUS changed.  Plan:   Continue to treat the treatable but no CPR or intubation, allowing natural death.  Patient sister is requesting short-term rehab at to increase strength before returning home.  [facility] with [facility].       35 minutes Quinn Axe, NP Palliative medicine team

## 2020-05-15 NOTE — Progress Notes (Signed)
Physical Therapy Treatment Patient Details Name: Tiffany Bridges MRN: [id number] DOB: 10-Jan-1934 Today's Date: 05/15/2020    History of Present Illness Patient is an 86 year old female with a past medical history of intellectual disability, chronic diastolic heart failure, type 2 diabetes, CKD stage IIIa, iron deficiency anemia who presented to the ED with hypoxia and [facility].    PT Comments    Continuing work on functional mobility and activity tolerance;  Session focused on functional transfers, and getting OOB to recliner; Currently requiring Mod/Max assist for bed mobility and basic pivot transfer; Once moving is initiated, she is able to participate more in goal-oriented mobility tasks; While I would like to see her be stronger and transfer with less need for assist before going home, I still think getting her home to familiar environment, caregivers, and routine is the most therapeutic place for Tiffany Bridges -- have been in contact with the Medical Team re: recommendations  Follow Up Recommendations  Home health PT;Supervision/Assistance - 24 hour;Other (comment) (Can [facility] ramp up [facility] services for Tiffany Bridges?)  Would like HHPT/OT/ST     Financial risk analyst (measurements PT);Wheelchair cushion (measurements PT);Hospital bed;Other (comment) (Consider hoyer lift and non-emergent ambulance ride hom) [facility] can determine if a hoyer lift is needed in the home   Recommendations for Other Services       Precautions / Restrictions Precautions Precautions: Fall;Other (comment) Precaution Comments: Dysphagia diet, including thickened liquids    Mobility  Bed Mobility Overal bed mobility: Needs Assistance Bed Mobility: Rolling;Sidelying to Sit Rolling: Mod assist Sidelying to sit: Mod assist       General bed mobility comments: With frequent cues and encouragement, pt able to demo improved ability to complete bed mobility, but required assistance to advance  B LEs to EOB due to weakness, tactile cues for pulling with bedrail  Transfers Overall transfer level: Needs assistance Equipment used: 1 person hand held assist Transfers: Sit to/from [employer] Transfers Sit to Stand: Max assist Stand pivot transfers: Mod assist       General transfer comment: Max assist and support to initiate lift to stand; once standing, able to reach for recliner and shift weight to turn and sit  Ambulation/Gait                 Stairs             Wheelchair Mobility    Modified Rankin (Stroke Patients Only)       Balance     Sitting balance-Leahy Scale: Fair       Standing balance-Leahy Scale: Poor Standing balance comment: Mod A to Max A to maintain standing balance                            Cognition Arousal/Alertness: Awake/alert Behavior During Therapy: Flat affect Overall Cognitive Status: History of cognitive impairments - at baseline                                 General Comments: Lots of coaxing to get some participation; agreeable to OOB when lunch was delivered      Exercises      General Comments        Pertinent Vitals/Pain Pain Assessment: Faces Faces Pain Scale: Hurts a little bit Pain Location: pt reponded "yes" to pain, but unable to vocalize location Pain Descriptors / Indicators: Grimacing  Pain Intervention(s): Monitored during session    Home Living                      Prior Function            PT Goals (current goals can now be found in the care plan section) Acute Rehab PT Goals Patient Stated Goal: Did not state PT Goal Formulation: With patient Time For Goal Achievement: 05/26/20 Potential to Achieve Goals: Good Progress towards PT goals: Progressing toward goals (slowly)    Frequency    Min 3X/week      PT Plan Discharge plan needs to be updated;Frequency needs to be updated    Co-evaluation              AM-PAC PT "6  Clicks" Mobility   Outcome Measure  Help needed turning from your back to your side while in a flat bed without using bedrails?: A Lot Help needed moving from lying on your back to sitting on the side of a flat bed without using bedrails?: A Lot Help needed moving to and from a bed to a chair (including a wheelchair)?: A Lot Help needed standing up from a chair using your arms (e.g., wheelchair or bedside chair)?: A Lot Help needed to walk in hospital room?: A Lot Help needed climbing 3-5 steps with a railing? : Total 6 Click Score: 11    End of Session Equipment Utilized During Treatment: Other (comment) (none, pt declined gait belt) Activity Tolerance: Patient limited by fatigue Patient left: in chair;with call bell/phone within reach;with chair alarm set;with nursing/sitter in room Nurse Communication: Mobility status PT Visit Diagnosis: Repeated falls (R29.6);Muscle weakness (generalized) (M62.81);Difficulty in walking, not elsewhere classified (R26.2)      Time: 5638-7564 PT Time Calculation (min) (ACUTE ONLY): 21 min  Charges:  $Therapeutic Activity: 8-22 mins                     Tiffany Bridges, PT  Acute Rehabilitation Services Pager [phone] Office [place] 05/15/2020, 4:02 PM

## 2020-05-15 NOTE — Progress Notes (Signed)
Subjective:  O/N Events: None   Patient seems to be considerably improved and more talkative today. She denies any symptoms today and admits to having a good appetite. Patients breakfast was positioned in front of her, and she began eating.     Objective:  Vital signs in last 24 hours: Vitals:   05/14/20 0625 05/14/20 0934 05/14/20 2110 05/15/20 0522  BP: (!) 142/65 136/67 99/75 131/73  Pulse: 83 80 87 81  Resp: 16 18 16    Temp: 98.4 F (36.9 C) 98.2 F (36.8 C) 99.3 F (37.4 C) (!) 96.6 F (35.9 C)  TempSrc: Oral  Axillary Axillary  SpO2: 100% 99% 96% 99%  Weight:   40.4 kg   Height:       Physical Exam Constitutional:      General: She is not in acute distress.    Appearance: She is not ill-appearing or diaphoretic.     Comments: Energetic, appears at baseline. Able to converse and follow simple commands.   HENT:     Head: Normocephalic and atraumatic.  Cardiovascular:     Rate and Rhythm: Normal rate and regular rhythm.     Pulses: Normal pulses.     Heart sounds: Normal heart sounds. No murmur heard.  No friction rub. No gallop.   Pulmonary:     Effort: Pulmonary effort is normal.     Breath sounds: Normal breath sounds. No wheezing, rhonchi or rales.  Abdominal:     General: Abdomen is flat. Bowel sounds are normal.     Tenderness: There is no abdominal tenderness.  Neurological:     Mental Status: She is alert.      LABS:  CBC Latest Ref Rng & Units 05/14/2020 05/13/2020 05/12/2020  WBC 4.0 - 10.5 K/uL 15.7(H) 22.5(H) 21.5(H)  Hemoglobin 12.0 - 15.0 g/dL 9.6(L) 9.9(L) 10.4(L)  Hematocrit 36 - 46 % 33.6(L) 33.5(L) 36.3  Platelets 150 - 400 K/uL 289 313 312   BMP Latest Ref Rng & Units 05/15/2020 05/14/2020 05/13/2020  Glucose 70 - 99 mg/dL 207(H) 202(H) 113(H)  BUN 8 - 23 mg/dL 33(H) 42(H) 45(H)  Creatinine 0.44 - 1.00 mg/dL 2.29(H) 3.00(H) 3.13(H)  Sodium 135 - 145 mmol/L 143 151(H) 155(H)  Potassium 3.5 - 5.1 mmol/L 4.4 3.6 3.8  Chloride 98 - 111  mmol/L 112(H) 115(H) 119(H)  CO2 22 - 32 mmol/L 19(L) 22 23  Calcium 8.9 - 10.3 mg/dL 8.0(L) 8.2(L) 8.2(L)    Assessment/Plan:  Active Problems:   Acute renal failure (ARF) (HCC)   Aspiration pneumonia (HCC)   Hypernatremia   Goals of care, counseling/discussion   Palliative care by specialist   DNR (do not resuscitate) discussion  86yo female with PMH of intellectual disability, HFpEF, IDA, TIIDM, AKI, upper GI bleed who presented with recurrence of aspiration pneumonia with acute renal injury on chronic kidney disease.   Multifocal Aspiration Pneumonia: Leukocytosis resolving, 10.8 today from 15. Vitals continue to be stable.  - Continue Unasyn - Blood Cultures: NGTD x4  - Palliative discussion yesterday family agrees to en[facility] for help in choosing [facility].    Acute Renal Failure on CKD Stage III Hypernatremia (Resolved)  Sodium 143.  - Continue IV resuscitation  - Cr: 2.29<-3.00 - Trend BMP   Type II DM  - Continue moderate SSI   IDA PUD - Continue PPI  - Will restart Fe supplementation after infection resolves.    HTN:  - Continue Lopressor 12.5 mg BID.   VTE: heparin  IVF: none  Diet: dysphagia 1 Code: full   Dispo: Anticipated discharge pending medical work-up.   Maudie Mercury, MD 05/15/2020, 6:25 AM [phone]

## 2020-05-15 NOTE — Progress Notes (Addendum)
Pharmacy Antibiotic Note  Tiffany Bridges is a 86 y.o. female admitted on 05/10/2020 with pneumonia and sepsis.  Pharmacy consulted for Unasyn dosing for sepsis 2/2 aspiration pna.  Afebrile. WBC 22.5>15.7 >10.8. Leukocytosis resolving, AKI on CKD stage III:  Scr 4/41 on admit date 6/23, has improved, down to 2.29 today. Estimated CrCl is 11.7 ml/min. UOP 0.9 ml/kg/hr.   Cultures remain negative to date.  cefepime 6/23 >> 6/25 Vancomycin 6/23 x 1 Unasyn 6/25>>   6/23 BCx: >> ngtd 6/23 UCx: 20,000 yeast 6/23 MRSA PCR: neg  Plan: Change Unasyn dose to  3g IV Q12H, due to Scr improved, CrCl is >10 ml/min.  Monitor clinical status, renal function and culture results daily.    Height: 5\' 6"  (167.6 cm) Weight: 40.4 kg (89 lb 1.1 oz) IBW/kg (Calculated) : 59.3  Temp (24hrs), Avg:97.8 F (36.6 C), Min:96.6 F (35.9 C), Max:99.3 F (37.4 C)  Recent Labs  Lab 05/10/20 1335 05/10/20 1335 05/10/20 1554 05/11/20 0014 05/11/20 0436 05/12/20 0428 05/13/20 1443 05/14/20 0437 05/15/20 0449 05/15/20 0626  WBC 18.1*   < >  --   --  18.1* 21.5* 22.5* 15.7*  --  10.8*  CREATININE 4.41*   < >  --  3.54* 3.55* 3.54* 3.13* 3.00* 2.29*  --   LATICACIDVEN 4.9*  --  2.4* 1.4  --   --   --   --   --   --    < > = values in this interval not displayed.    Estimated Creatinine Clearance: 11.7 mL/min (A) (by C-G formula based on SCr of 2.29 mg/dL (H)).    No Known Allergies   Thank you for allowing pharmacy to be a part of this patient's care.  Nicole Cella, RPh Clinical Pharmacist  05/15/2020 12:00 PM

## 2020-05-15 NOTE — Progress Notes (Signed)
Physical Therapy Treatment Patient Details Name: Tiffany Bridges MRN: [id number] DOB: 01/23/1934 Today's Date: 05/15/2020    History of Present Illness Patient is an 86 year old female with a past medical history of intellectual disability, chronic diastolic heart failure, type 2 diabetes, CKD stage IIIa, iron deficiency anemia who presented to the ED with hypoxia and [facility]    PT Comments    Continuing work on functional mobility and activity tolerance;  Session focused on assisting pt back to bed; contniues to require heavy mod assist for transfers;   Lots of communication with the Medical Team; Per Dr. Sharon Seller, SW from [facility] is recommending SNF for rehab, and PT is in agreement  Follow Up Recommendations  SNF;Other (comment) (Per communication with [facility] for post-acute rehab)     Financial risk analyst (measurements PT);Wheelchair cushion (measurements PT);Hospital bed;Other (comment) (Consider hoyer lift and non-emergent ambulance ride hom)    Recommendations for Other Services       Precautions / Restrictions Precautions Precautions: Fall;Other (comment) Precaution Comments: Dysphagia diet, including thickened liquids    Mobility  Bed Mobility Overal bed mobility: Needs Assistance Bed Mobility: Sit to Supine Rolling: Mod assist Sidelying to sit: Mod assist   Sit to supine: Min assist   General bed mobility comments: min assist to help LEs into bed, and to position in bed  Transfers Overall transfer level: Needs assistance Equipment used: 1 person hand held assist Transfers: Sit to/from [employer] Transfers Sit to Stand: Mod assist Stand pivot transfers: Mod assist       General transfer comment: Max assist and support to initiate lift to stand; once standing, able to reach for recliner and shift weight to turn and sit  Ambulation/Gait Ambulation/Gait assistance: Mod assist Gait Distance (Feet): 2 Feet (small  steps bed to recliner) Assistive device: 1 person hand held assist       General Gait Details: Tooks a few steps recliner back to bed   Stairs             Wheelchair Mobility    Modified Rankin (Stroke Patients Only)       Balance     Sitting balance-Leahy Scale: Fair       Standing balance-Leahy Scale: Poor Standing balance comment: Mod A to Max A to maintain standing balance                            Cognition Arousal/Alertness: Awake/alert Behavior During Therapy: Flat affect Overall Cognitive Status: History of cognitive impairments - at baseline                                 General Comments: Lots of coaxing to get some participation; agreeable to OOB when lunch was delivered      Exercises      General Comments        Pertinent Vitals/Pain Pain Assessment: Faces Faces Pain Scale: Hurts a little bit Pain Location: pt reponded "yes" to pain, but unable to vocalize location Pain Descriptors / Indicators: Grimacing Pain Intervention(s): Monitored during session    Home Living                      Prior Function            PT Goals (current goals can now be found in the care plan section)  Acute Rehab PT Goals Patient Stated Goal: Did not state PT Goal Formulation: With patient Time For Goal Achievement: 05/26/20 Potential to Achieve Goals: Good Progress towards PT goals: Progressing toward goals    Frequency    Min 3X/week      PT Plan Discharge plan needs to be updated    Co-evaluation              AM-PAC PT "6 Clicks" Mobility   Outcome Measure  Help needed turning from your back to your side while in a flat bed without using bedrails?: A Lot Help needed moving from lying on your back to sitting on the side of a flat bed without using bedrails?: A Lot Help needed moving to and from a bed to a chair (including a wheelchair)?: A Lot Help needed standing up from a chair using your arms  (e.g., wheelchair or bedside chair)?: A Lot Help needed to walk in hospital room?: A Lot Help needed climbing 3-5 steps with a railing? : Total 6 Click Score: 11    End of Session Equipment Utilized During Treatment: Other (comment) (none, pt declined gait belt) Activity Tolerance: Patient tolerated treatment well Patient left: in bed;with call bell/phone within reach;with bed alarm set Nurse Communication: Mobility status PT Visit Diagnosis: Repeated falls (R29.6);Muscle weakness (generalized) (M62.81);Difficulty in walking, not elsewhere classified (R26.2)     Time: 7793-9030 PT Time Calculation (min) (ACUTE ONLY): 9 min  Charges:  $Therapeutic Activity: 8-22 mins                     Roney Marion, PT  Acute Rehabilitation Services Pager [phone] Office [place] 05/15/2020, 6:55 PM

## 2020-05-15 NOTE — Progress Notes (Signed)
Internal Medicine Attending:   I saw and examined the patient. I reviewed the resident's note and I agree with the resident's findings and plan as documented in the resident's note.  Patient appears to be improving.  She states that she is hungry and appears more talkative today.  Patient was initially admitted to the hospital with multifocal pneumonia likely secondary to aspiration.  We will continue with Unasyn for now.  We will likely transition to oral Augmentin when ready for DC to complete a 10-day course.  Leukocytosis has continued to improve and is down to 10.8 today.  Patient was also noted to have AKI on admission as well as hypernatremia secondary to hypovolemia.  Patient's creatinine continues to improve and is down to 2.29 today.  Patient's hypernatremia has resolved.  No further work-up at this time.  Patient stable for DC to SNF once bed is available.

## 2020-05-15 NOTE — Discharge Summary (Addendum)
Name: Tiffany Bridges MRN: [id number] DOB: Mar 31, 1934 86 y.o. PCP: Willene Hatchet, NP  Date of Admission: 05/10/2020  1:10 PM Date of Discharge: 05/17/2020 Attending Physician: Aldine Contes, MD  Discharge Diagnosis: 1. Multifocal Aspiration Pneumonia  2. Hypernatremia 3. Acute Kidney Injury  4. Type II Diabetes Mellitis  5. Iron Deficiency Anemia 6. Peptic Ulcer Disease  7. Hypertension  8. HFpEF    Discharge Medications: Allergies as of 05/17/2020   No Known Allergies     Medication List    STOP taking these medications   amLODipine 10 MG tablet Commonly known as: NORVASC     TAKE these medications   acetaminophen 325 MG tablet Commonly known as: TYLENOL Take 650 mg by mouth every 6 (six) hours as needed for mild pain.   amoxicillin-clavulanate 500-125 MG tablet Commonly known as: Augmentin Take 1 tablet (500 mg total) by mouth in the morning and at bedtime for 2 days.   CENTRUM ADULTS PO Take 1 tablet by mouth daily.   epoetin alfa-epbx 10000 UNIT/ML injection Commonly known as: RETACRIT Inject 10,000 Units into the skin every 14 (fourteen) days.   ferrous sulfate 325 (65 FE) MG tablet Take 325 mg by mouth daily with breakfast.   furosemide 20 MG tablet Commonly known as: LASIX Take 1 tablet (20 mg total) by mouth daily as needed for edema (weight gain >3lbs, shortness of breath, lower extremity swelling). What changed:   when to take this  reasons to take this   glipiZIDE 5 MG tablet Commonly known as: GLUCOTROL Take 2.5 mg by mouth daily before breakfast.   Glucerna Liqd Take 1 Can by mouth in the morning and at bedtime.   ipratropium-albuterol 0.5-2.5 (3) MG/3ML Soln Commonly known as: DUONEB Take 3 mLs by nebulization 4 (four) times daily as needed (For wheezing, Shortness of breath).   linagliptin 5 MG Tabs tablet Commonly known as: TRADJENTA Take 5 mg by mouth daily.   metoprolol succinate 25 MG 24 hr tablet Commonly known as:  TOPROL-XL Take 25 mg by mouth daily.   oxybutynin 5 MG tablet Commonly known as: DITROPAN Take 5 mg by mouth at bedtime.   pantoprazole 40 MG tablet Commonly known as: PROTONIX Take 40 mg by mouth 2 (two) times daily.   Resource ThickenUp Clear Powd Take 1 Can by mouth See admin instructions. Measure recommended amount of thickener. Add to beverage of choice to make nectar like consistency for all liquids.   sennosides-docusate sodium 8.6-50 MG tablet Commonly known as: SENOKOT-S Take 1 tablet by mouth daily.       Disposition and follow-up:   [name] was discharged from [facility] in Stable condition.  At the hospital follow up visit please address: Multifocal Aspiration Pneumonia:   - Completes Medication course - complete two more days of augmentin  - Dysphagia 1 diet Acute Kidney Injury   - Check BMP  Type II Diabetes Mellitis   - Continue medications Iron Deficiency Anemia  - Restart Iron supplementation after completion of antibiotic therapy.  Peptic Ulcer Disease   - Continue PPI  Hypertension HFpEF   - Continue metoprolol   - Give Lasix 20 mg if signs of volume overload included leg swelling or shortness of breath or >3lb weight gain in short amount of time. She was previously overdiuresed.  Renal US Findings: Bladder Mass vs. Debris in bladder on renal US. Repeat bladder US and discuss cystoscopy with family vs. pursuance of aggressive care.   2.  Labs / imaging needed at time of follow-up: Chest X Ray in 4-6 weeks, BMP, CBC,   3.  Pending labs/ test needing follow-up:  None   Follow-up Appointments:  Contact information for after-discharge care    Destination    [facility] Preferred SNF .   Service: Skilled Nursing Contact information: [address] [place] Hospital Course by problem list: 1. Multifocal Aspiration Pneumonia  Patient was  [facility] with tachycardia, fevers, and shortness of breath. During her ED course she was found to have leukocytosis, she had bilateral rales on chest auscultation, and her chest xray was concerning for mass vs pneumonia, and multifocal pneumonia was confirmed on CT Chest. She was started on vancomycin and cefepime. Her antibiotics were changed to Unasyn with resolution of her leukocytosis. During her hospitalization, speech had seen her due to her possible aspiration risk. It was recommended she be on a Dysphagia 1 diet. She was discharged in stable condition with transition of unasyn to Augmentin for two more days.    2 Hypernatremia  Acute Kidney Injury on CKD Stage III She has had increasingly poor po intake, partially due to her recent pneumonia although this has been ongoing. She was fluid resuscitated with improvement in her renal function and resolution of her hyponatremia. She will likely need encouragement for fluid intake in the future.   3. Type II Diabetes Mellitis  While admitted she was treated with sliding scale insulin. She was continued on her home medication at discharge.   4 Iron Deficiency Anemia  Peptic Ulcer Disease  Recently admitted to [facility] for upper GI bleed. Hemoglobin has been stable during admission. She will need to be restarted on her iron supplementation once she completes her antibiotic therapy.   5 Hypertension  HFpEF  Blood pressure remained stable during admission only on her metoprolol tartrate 12.5 mg. Will hold norvasc at discharge. She was overdiuresed at admission with AKI and hypernatremia. Lasix 20 mg should be prn for lower extremity swelling, shortness of breath or more than 3 lb weight gain in two days or less.   Discharge Vitals:   BP (!) 133/59 (BP Location: Left Arm)   Pulse 87   Temp 98.7 F (37.1 C) (Oral)   Resp 18   Ht 5\' 6"  (1.676 m)   Wt 43.9 kg   SpO2 97%   BMI 15.62 kg/m   Pertinent Labs,  Studies, and Procedures:   US Renal 05/11/20 IMPRESSION: Question bladder mass 2.2 cm diameter recommend correlation with a cystoscopy.  Cyst identified within both kidneys, including a septated cyst at the peripelvic region of the RIGHT kidney 12 mm greatest size.  Medical renal disease changes of both kidneys without hydronephrosis.  Small BILATERAL pleural effusions. Electronically Signed   By: Lavonia Dana M.D.   On: 05/11/2020 13:19  CT Chest wo Contrast 05/10/20 IMPRESSION: 1. Multifocal airspace opacities involving both upper lobes and the right lower lobe concerning for multifocal pneumonia. 2. Cardiac enlargement, small right pleural effusion and mild pulmonary edema compatible with CHF. 3. Multi vessel coronary artery calcifications noted. 4. Duplicated superior vena cava. 5. Aortic atherosclerosis.  Aortic Atherosclerosis (ICD10-I70.0). Electronically Signed   By: Kerby Moors M.D.   On: 05/10/2020 19:02  DG Chest Port 1 View 05/10/20 IMPRESSION: 1. Persistent opacity in the right mid to lower  lung, more mass-like in appearance in the recent prior examinations. The possibility of underlying neoplasm should be considered. Further evaluation with contrast enhanced chest CT is suggested if clinically appropriate. 2. Diffuse peribronchial cuffing, concerning for acute bronchitis. 3. Probable trace right pleural effusion. 4. Aortic atherosclerosis. Electronically Signed   By: Vinnie Langton M.D.   On: 05/10/2020 15:53  US Renal 04/20/20 MPRESSION: 1. No obstructive uropathy. Increased renal echogenicity most consistent with chronic medical renal disease. 2. Simple and septated bilateral renal cysts, largest 11 mm. 3. Thick walled urinary bladder which may be accentuated by nondistention. Electronically Signed   By: Keith Rake M.D.   On: 04/20/2020 18:07   Discharge Instructions: Discharge Instructions    Diet - low sodium heart healthy    Complete by: As directed    Increase activity slowly   Complete by: As directed       Signed: Maudie Mercury, MD 05/17/2020, 11:28 AM   Pager: [phone]

## 2020-05-16 LAB — BASIC METABOLIC PANEL
Anion gap: 10 (ref 5–15)
BUN: 38 mg/dL — ABNORMAL HIGH (ref 8–23)
CO2: 23 mmol/L (ref 22–32)
Calcium: 8 mg/dL — ABNORMAL LOW (ref 8.9–10.3)
Chloride: 113 mmol/L — ABNORMAL HIGH (ref 98–111)
Creatinine, Ser: 2.52 mg/dL — ABNORMAL HIGH (ref 0.44–1.00)
GFR calc Af Amer: 20 mL/min — ABNORMAL LOW (ref >60)
GFR calc non Af Amer: 17 mL/min — ABNORMAL LOW (ref >60)
Glucose, Bld: 233 mg/dL — ABNORMAL HIGH (ref 70–99)
Potassium: 3.4 mmol/L — ABNORMAL LOW (ref 3.5–5.1)
Sodium: 146 mmol/L — ABNORMAL HIGH (ref 135–145)

## 2020-05-16 LAB — CBC
HCT: 33.6 % — ABNORMAL LOW (ref 36.0–46.0)
Hemoglobin: 9.8 g/dL — ABNORMAL LOW (ref 12.0–15.0)
MCH: 23 pg — ABNORMAL LOW (ref 26.0–34.0)
MCHC: 29.2 g/dL — ABNORMAL LOW (ref 30.0–36.0)
MCV: 78.9 fL — ABNORMAL LOW (ref 80.0–100.0)
Platelets: 346 10*3/uL (ref 150–400)
RBC: 4.26 MIL/uL (ref 3.87–5.11)
RDW: 19.7 % — ABNORMAL HIGH (ref 11.5–15.5)
WBC: 9 10*3/uL (ref 4.0–10.5)
nRBC: 0 % (ref 0.0–0.2)

## 2020-05-16 LAB — CULTURE, BLOOD (ROUTINE X 2): Culture: NO GROWTH

## 2020-05-16 LAB — GLUCOSE, CAPILLARY
Glucose-Capillary: 201 mg/dL — ABNORMAL HIGH (ref 70–99)
Glucose-Capillary: 177 mg/dL — ABNORMAL HIGH (ref 70–99)
Glucose-Capillary: 70 mg/dL (ref 70–99)
Glucose-Capillary: 159 mg/dL — ABNORMAL HIGH (ref 70–99)

## 2020-05-16 LAB — SARS CORONAVIRUS 2 BY RT PCR (HOSPITAL ORDER, PERFORMED IN ~~LOC~~ HOSPITAL LAB): SARS Coronavirus 2: NEGATIVE

## 2020-05-16 MED ORDER — SODIUM CHLORIDE 0.9 % IV SOLN
INTRAVENOUS | Status: DC | PRN
  Administered 2020-05-16: 250 mL via INTRAVENOUS

## 2020-05-16 MED ORDER — AMOXICILLIN-POT CLAVULANATE 500-125 MG PO TABS: 1.0000 | ORAL_TABLET | Freq: Two times a day (BID) | ORAL | 0 refills | Status: AC

## 2020-05-16 MED ORDER — POTASSIUM CHLORIDE CRYS ER 20 MEQ PO TBCR
40.0000 meq | EXTENDED_RELEASE_TABLET | Freq: Once | ORAL | Status: AC
  Administered 2020-05-16: 40 meq via ORAL
  Filled 2020-05-16: qty 2

## 2020-05-16 MED ORDER — FUROSEMIDE 20 MG PO TABS: 20.0000 mg | ORAL_TABLET | Freq: Every day | ORAL | 0 refills | Status: DC | PRN

## 2020-05-16 MED ORDER — AMOXICILLIN-POT CLAVULANATE 875-125 MG PO TABS
1.0000 | ORAL_TABLET | Freq: Two times a day (BID) | ORAL | 0 refills | Status: DC
Start: 2020-05-17 — End: 2020-05-16

## 2020-05-16 NOTE — Progress Notes (Signed)
Internal Medicine Attending:   I saw and examined the patient. I reviewed the resident's note and I agree with the resident's findings and plan as documented in the resident's note.   Patient was sleeping this morning but was easily arousable.  She denies any complaints currently.  Patient was initially admitted to the hospital with multifocal pneumonia likely secondary to aspiration.  We will continue with Unasyn for now.  Will transition to Augmentin on discharge to complete a 7-day course.  Patient's leukocytosis has resolved.  She has not had any fevers.  No further work-up at this time.  Patient stable for DC to SNF once bed is available.  Of note, patient is not taking her meals.  This is evidenced by her sodium increasing to 146 and a creatinine increasing back up to 2.52.  I am concerned that patient will not be able to take enough orally to sustain her nutritional needs.  We will need to discuss this with the family and further clarify her goals of care.

## 2020-05-16 NOTE — Progress Notes (Signed)
   Subjective:  O/N Events: None   Tiffany Bridges was seen at bedside.  She was sleeping this morning answered questions by nodding and shaking her head. Discussed ongoing treatment for her pneumonia. She voiced understanding.   Objective:  Vital signs in last 24 hours: Vitals:   05/15/20 0811 05/15/20 1500 05/15/20 2047 05/16/20 0546  BP: (!) 159/73 (!) 142/78 112/61 (!) 162/71  Pulse: 83 80 85 81  Resp: 16 15  15   Temp: (!) 97.5 F (36.4 C)  99.1 F (37.3 C)   TempSrc: Oral  Axillary   SpO2: 96% 97% 100% 97%  Weight:   44 kg   Height:       Physical Exam Constitutional:      General: She is not in acute distress.    Appearance: She is not toxic-appearing.  Cardiovascular:     Rate and Rhythm: Normal rate and regular rhythm.     Pulses: Normal pulses.     Heart sounds: Normal heart sounds. No murmur heard.  No gallop.   Pulmonary:     Effort: Pulmonary effort is normal.     Breath sounds: Normal breath sounds. No wheezing, rhonchi or rales.  Abdominal:     General: Abdomen is flat. Bowel sounds are normal.     Tenderness: There is no abdominal tenderness. There is no guarding.  Neurological:     Mental Status: She is alert.     LABS:  CBC Latest Ref Rng & Units 05/15/2020 05/14/2020 05/13/2020  WBC 4.0 - 10.5 K/uL 10.8(H) 15.7(H) 22.5(H)  Hemoglobin 12.0 - 15.0 g/dL 9.5(L) 9.6(L) 9.9(L)  Hematocrit 36 - 46 % 32.7(L) 33.6(L) 33.5(L)  Platelets 150 - 400 K/uL 302 289 313   BMP Latest Ref Rng & Units 05/15/2020 05/14/2020 05/13/2020  Glucose 70 - 99 mg/dL 207(H) 202(H) 113(H)  BUN 8 - 23 mg/dL 33(H) 42(H) 45(H)  Creatinine 0.44 - 1.00 mg/dL 2.29(H) 3.00(H) 3.13(H)  Sodium 135 - 145 mmol/L 143 151(H) 155(H)  Potassium 3.5 - 5.1 mmol/L 4.4 3.6 3.8  Chloride 98 - 111 mmol/L 112(H) 115(H) 119(H)  CO2 22 - 32 mmol/L 19(L) 22 23  Calcium 8.9 - 10.3 mg/dL 8.0(L) 8.2(L) 8.2(L)    Assessment/Plan:  Active Problems:   Acute renal failure (ARF) (HCC)   Aspiration pneumonia  (HCC)   Hypernatremia   Goals of care, counseling/discussion   Palliative care by specialist   DNR (do not resuscitate) discussion  86yo female with PMH of intellectual disability, HFpEF, IDA, TIIDM, AKI, upper GI bleed who presented with recurrence of aspiration pneumonia with acute renal injury on chronic kidney disease.    Multifocal Aspiration Pneumonia: Leukocytosis resolving, 9.0 Vitals continue to be stable.  - Continue Unasyn - Palliative discussion yesterday family agrees to [facility] in [facility].    Acute Renal Failure on CKD Stage III Hypernatremia (Resolved)  Sodium 146.  - Continue IV resuscitation  - Cr: 2.52 - Trend BMP   Type II DM  - Continue moderate SSI   IDA PUD - Continue PPI  - Will restart Fe supplementation after infection resolves.    HTN:  - Continue Lopressor 12.5 mg BID.   VTE: heparin  IVF: none Diet: dysphagia 1 Code: full   Dispo: Medically stable for discharge, pending SNF placement.   Maudie Mercury, MD 05/16/2020, 6:10 AM [phone]

## 2020-05-16 NOTE — NC FL2 (Signed)
[place] MEDICAID FL2 LEVEL OF CARE SCREENING TOOL     IDENTIFICATION  Patient Name: Tiffany Bridges Birthdate: 1934-06-24 Sex: female Admission Date (Current Location): 05/10/2020  [facility] and [place] Number:  Herbalist and Address:  [facility] [place] [place] [address], [place], [place] [address]      Provider Number: [id number]  Attending Physician Name and Address:  Aldine Contes, MD  Relative Name and Phone Number:  [name] - sister; [phone]    Current Level of Care: Hospital Recommended Level of Care: [place] Prior Approval Number:    Date Approved/Denied:   PASRR Number: [id number] (Eff. 05/02/20)  Discharge Plan: SNF    Current Diagnoses: Patient Active Problem List   Diagnosis Date Noted  . Goals of care, counseling/discussion   . Palliative care by specialist   . DNR (do not resuscitate) discussion   . Aspiration pneumonia ([place])   . Hypernatremia   . Acute renal failure (ARF) ([place]) 05/10/2020    Orientation RESPIRATION BLADDER Height & Weight     Self  Normal External catheter, Incontinent (External catheter placed 6/24) Weight: 97 lb (44 kg) Height:  5\' 6"  (167.6 cm)  BEHAVIORAL SYMPTOMS/MOOD NEUROLOGICAL BOWEL NUTRITION STATUS      Continent Diet (DYS 1 nectar thick)  AMBULATORY STATUS COMMUNICATION OF NEEDS Skin   Total Care (Patient was unable to ambulate wth PT on 6/25) Verbally Normal                       Personal Care Assistance Level of Assistance  Bathing, Feeding, Dressing Bathing Assistance: Maximum assistance Feeding assistance: Maximum assistance Dressing Assistance: Maximum assistance     Functional Limitations Info  Sight, Hearing, Speech Sight Info: Adequate Hearing Info: Adequate Speech Info: Adequate    SPECIAL CARE FACTORS FREQUENCY  PT (By licensed PT), OT (By licensed OT), Speech therapy     PT Frequency: Evaluation 6/25. PT at [facility] Eval and Treat, a minimum  of 5 days per week OT Frequency: Evaluation 6/25. OT at [facility] Eval and Treat, a minimum of 5 days per week     Speech Therapy Frequency: Evaluated 6/25. DYS 1 - Puree nectar thick      Contractures Contractures Info: Not present    Additional Factors Info  Code Status, Allergies, Insulin Sliding Scale Code Status Info: DNR Allergies Info: No known allergies   Insulin Sliding Scale Info: 0-15 Units 3 tmes per day with meals       Current Medications (05/16/2020):  This is the current hospital active medication list Current Facility-Administered Medications  Medication Dose Route Frequency Provider Last Rate Last Admin  . Ampicillin-Sulbactam (UNASYN) 3 g in sodium chloride 0.9 % 100 mL IVPB  3 g Intravenous Q12H Wendee Beavers, RPH 200 mL/hr at 05/16/20 0906 3 g at 05/16/20 0906  . bisacodyl (DULCOLAX) suppository 10 mg  10 mg Rectal Daily PRN Seawell, Jaimie A, DO      . chlorhexidine (PERIDEX) 0.12 % solution 15 mL  15 mL Mouth Rinse BID Aldine Contes, MD   15 mL at 05/16/20 0906  . heparin injection 5,000 Units  5,000 Units Subcutaneous Q8H Neva Seat, MD   5,000 Units at 05/16/20 [phone]  . insulin aspart (novoLOG) injection 0-15 Units  0-15 Units Subcutaneous TID WC Seawell, Jaimie A, DO   5 Units at 05/16/20 0920  . ipratropium-albuterol (DUONEB) 0.5-2.5 (3) MG/3ML nebulizer solution 3 mL  3 mL Nebulization QID  PRN Neva Seat, MD      . MEDLINE mouth rinse  15 mL Mouth Rinse q12n4p Aldine Contes, MD   15 mL at 05/14/20 1738  . metoprolol tartrate (LOPRESSOR) tablet 12.5 mg  12.5 mg Oral BID Seawell, Jaimie A, DO   12.5 mg at 05/16/20 0907  . multivitamin with minerals tablet 1 tablet  1 tablet Oral Daily Aldine Contes, MD   1 tablet at 05/16/20 0906  . pantoprazole (PROTONIX) EC tablet 40 mg  40 mg Oral BID Seawell, Jaimie A, DO   40 mg at 05/16/20 0907  . polyethylene glycol (MIRALAX / GLYCOLAX) packet 17 g  17 g Oral Daily Seawell, Jaimie A, DO   17 g at  05/16/20 0902  . Resource ThickenUp Clear   Oral PRN Aldine Contes, MD      . sodium chloride flush (NS) 0.9 % injection 3 mL  3 mL Intravenous Q12H Neva Seat, MD   3 mL at 05/15/20 2125     Discharge Medications: Please see discharge summary for a list of discharge medications.  Relevant Imaging Results:  Relevant Lab Results:   Additional Information [phone]. Patient is PACE participant and SW is Marcie Bal.  Sable Feil, LCSW

## 2020-05-16 NOTE — Progress Notes (Signed)
Inpatient Diabetes Program Recommendations  AACE/ADA: New Consensus Statement on Inpatient Glycemic Control  Target Ranges:  Prepandial:   less than 140 mg/dL      Peak postprandial:   less than 180 mg/dL (1-2 hours)      Critically ill patients:  140 - 180 mg/dL   Results for Tiffany Bridges, Tiffany Bridges (MRN [id number]) as of 05/16/2020 07:37  Ref. Range 05/15/2020 06:41 05/15/2020 11:38 05/15/2020 16:09 05/15/2020 21:02 05/16/2020 06:36  Glucose-Capillary Latest Ref Range: 70 - 99 mg/dL 195 (H) 140 (H) 133 (H) 256 (H) 201 (H)   Review of Glycemic Control  Diabetes history: DM2 Outpatient Diabetes medications: Glipizide 2.5 mg QAM, Tradjenta 5 mg daily Current orders for Inpatient glycemic control: Novolog 0-15 units TID with meals  Inpatient Diabetes Program Recommendations:    Insulin-Correction: Please consider adding Novolog 0-5 units QHS for bedtime correction.  Thanks, Barnie Alderman, RN, MSN, CDE Diabetes Coordinator Inpatient Diabetes Program [phone] (Team Pager from 8am to 5pm)

## 2020-05-16 NOTE — TOC Progression Note (Addendum)
Transition of Care [facility]) - Progression Note    Patient Details  Name: Zaire Vanbuskirk MRN: [id number] Date of Birth: 1934-04-11  [facility]) CM/SW Contact  Sharlet Salina Mila Homer, LCSW Phone Number: 05/16/2020, 5:26 PM  Clinical Narrative:  CSW talked with patient's sister Marcie Bal regarding Short-term rehab for patient and she expressed agreement. CSW advised by Ms. Marcie Bal that patient's daughter is age 11 and is at [facility] in [place]. Sister indicated that [facility] is her preference. Call made to [facility] and talked with admissions staff person Caryl Pina regarding patient and was advised that they can take patient. Talked with MD and nurse regarding COVID test needed.  Marcie Bal, PACE SW contacted and updated. CSW was advised that patient has had her COVID vaccinations.  CSW talked with Lexine Baton (3:35 pm) regarding patient's discharge and COVID test results pending. Chaney Born, they can admit patient tomorrow.        Expected Discharge Plan and [facility]           Expected Discharge Date: 05/16/20                                   Social Determinants of Health (SDOH) Interventions  No SDOH interventions requested or needed at this time.  Readmission Risk Interventions No flowsheet data found.

## 2020-05-16 NOTE — Progress Notes (Signed)
SLP Cancellation Note  Patient Details Name: Tiffany Bridges MRN: [id number] DOB: 1934-05-24   Cancelled treatment:       Reason Eval/Treat Not Completed: Patient declined, no reason specified  [name] continuing to refuse all PO intake despite max cues for PO trials. Palliative care is on team and family has decided DNR code. D/t pt declining PO trials, she is not appropriate for objective testing. Based on history and prior bedside tolerance, SLP team recommends nectar thick liquids and pureed solids. Diligent oral care as tolerated by pt is recommended. ST service to sign off at this time. Feel free to re-consult should pt become more alert/participatory in PO trials and we would be happy to return.  Edison Nicholson P. Dajon Lazar, M.S., [place] Pathologist Acute Rehabilitation Services Pager: [phone] 05/16/2020, 2:21 PM

## 2020-05-16 NOTE — Discharge Instructions (Addendum)
Tiffany Bridges,  It was a pleasure working with you during your hospitalization. You were diagnosed with aspiration pneumonia and treated with antibiotics. Please make sure to finish your antibiotics as prescribed. You were also found to have a kidney injury during your stay. We gave you fluids which helped your kidneys. Please drink plenty of fluids. Please go to the nearest emergency department to be reevaluated if you begin to have shortness of breath, chest pains, high fevers, or an acute change in your daily function.    Aspiration Pneumonia Aspiration pneumonia is an infection in the lungs. It occurs when saliva or liquid contaminated with bacteria is inhaled (aspirated) into the lungs. When these things get into the lungs, swelling (inflammation) and infection can occur. This can make it difficult to breathe. Aspiration pneumonia is a serious condition and can be life threatening. What are the causes? This condition is caused when saliva or liquid from the mouth, throat, or stomach is inhaled into the lungs, and when those fluids are contaminated with bacteria. What increases the risk? The following factors may make you more likely to develop this condition:  A narrowing of the tube that carries food to the stomach (esophageal narrowing).  Having gastroesophageal reflux disease (GERD).  Having a weak immune system.  Having diabetes.  Having poor oral hygiene.  Being malnourished. The condition is more likely to occur when a person's cough (gag) reflex, or ability to swallow, has decreased. Some things that can cause this decrease include:  Having a brain injury or disease, such as stroke, seizures, Parkinson disease, dementia, or amyotrophic lateral sclerosis (ALS).  Being given a general anesthetic for procedures.  Drinking too much alcohol. If a person passes out and vomits, vomit can be inhaled into the lungs.  Taking certain medicines, such as tranquilizers or sedatives. What  are the signs or symptoms? Symptoms of this condition include:  Fever.  A cough with secretions that are yellow, tan, or green.  Breathing problems, such as wheezing or shortness of breath.  Chest pain.  Being more tired than usual (fatigue).  Having a history of coughing while eating or drinking.  Bad breath.  Bluish color to the lips, skin, or fingers. How is this diagnosed? This condition may be diagnosed based on:  A physical exam.  Tests, such as: ? Chest X-ray. ? Sputum culture. Saliva and mucus (sputum) are collected from the lungs or the tubes that carry air to the lungs (bronchi). The sputum is then tested for bacteria. ? Oximetry. A sensor or clip is placed on areas such as a finger, earlobe, or toe to measure the oxygen level in your blood. ? Blood tests. ? Swallowing study. This test looks at how food is swallowed and whether it goes into your breathing tube (trachea) or esophagus. ? Bronchoscopy. This test uses a flexible tube (bronchoscope) to see inside the lungs. How is this treated? This condition may be treated with:  Medicines. Antibiotic medicine will be given to kill the pneumonia bacteria. Other medicines may also be used to reduce fever or pain.  Breathing assistance and oxygen therapy. Depending on how well you are breathing, you may need to be given oxygen, or you may need breathing support from a breathing machine (ventilator).  Thoracentesis. This is a procedure to remove fluid that has built up in the space between the linings of the chest wall and the lungs.  Feeding tube and diet change. For people who have difficulty swallowing, a feeding tube might  be placed in the stomach, or they may be asked to avoid certain food textures or liquids when eating. Follow these instructions at home: Medicines  Take over-the-counter and prescription medicines only as told by your health care provider. ? If you were prescribed an antibiotic medicine, take it  as told by your health care provider. Do not stop taking the antibiotic even if you start to feel better. ? Take cough medicine only if you are losing sleep. Cough medicine can prevent your body's natural ability to remove mucus from your lungs. General instructions  Carefully follow any eating instructions you were given, such as avoiding certain food textures or thickening your liquids. Thickening liquids reduces the risk of developing aspiration pneumonia again.  Use breathing exercises such as postural drainage, deep breathing, and incentive spirometry to help expel secretions.  Rest as instructed by your health care provider.  Sleep in a semi-upright position at night. Try to sleep in a reclining chair, or place a few pillows under your head.  Do not use any products that contain nicotine or tobacco, such as cigarettes and e-cigarettes. If you need help quitting, ask your health care provider.  Keep all follow-up visits as told by your health care provider. This is important. Contact a health care provider if:  You have a fever.  You have a worsening cough with yellow, tan, or green secretions.  You have coughing while eating or drinking. Get help right away if:  You have worsening shortness of breath, wheezing, or difficulty breathing.  You have chest pain. Summary  Aspiration pneumonia is an infection in the lungs. It is caused when saliva or liquid from the mouth, throat, or stomach is inhaled into the lungs.  Aspiration pneumonia is more likely to occur when a person's cough reflex or ability to swallow has decreased.  Symptoms of aspiration pneumonia include coughing, breathing problems, fever, and chest pain.  Aspiration pneumonia may be treated with antibiotic medicine, other medicines to reduce pain or fever, and breathing assistance or oxygen therapy. This information is not intended to replace advice given to you by your health care provider. Make sure you discuss  any questions you have with your health care provider. Document Revised: 10/17/2017 Document Reviewed: 12/10/2016 Elsevier Patient Education  2020 [place].    Acute Kidney Injury, Adult  Acute kidney injury is a sudden worsening of kidney function. The kidneys are organs that have several jobs. They filter the blood to remove waste products and extra fluid. They also maintain a healthy balance of minerals and hormones in the body, which helps control blood pressure and keep bones strong. With this condition, your kidneys do not do their jobs as well as they should. This condition ranges from mild to severe. Over time it may develop into long-lasting (chronic) kidney disease. Early detection and treatment may prevent acute kidney injury from developing into a chronic condition. What are the causes? Common causes of this condition include:  A problem with blood flow to the kidneys. This may be caused by: ? Low blood pressure (hypotension) or shock. ? Blood loss. ? Heart and blood vessel (cardiovascular) disease. ? Severe burns. ? Liver disease.  Direct damage to the kidneys. This may be caused by: ? Certain medicines. ? A kidney infection. ? Poisoning. ? Being around or in contact with toxic substances. ? A surgical wound. ? A hard, direct hit to the kidney area.  A sudden blockage of urine flow. This may be caused by: ?  Cancer. ? Kidney stones. ? An enlarged prostate in males. What are the signs or symptoms? Symptoms of this condition may not be obvious until the condition becomes severe. Symptoms of this condition can include:  Tiredness (lethargy), or difficulty staying awake.  Nausea or vomiting.  Swelling (edema) of the face, legs, ankles, or feet.  Problems with urination, such as: ? Abdominal pain, or pain along the side of your stomach (flank). ? Decreased urine production. ? Decrease in the force of urine flow.  Muscle twitches and cramps, especially in the  legs.  Confusion or trouble concentrating.  Loss of appetite.  Fever. How is this diagnosed? This condition may be diagnosed with tests, including:  Blood tests.  Urine tests.  Imaging tests.  A test in which a sample of tissue is removed from the kidneys to be examined under a microscope (kidney biopsy). How is this treated? Treatment for this condition depends on the cause and how severe the condition is. In mild cases, treatment may not be needed. The kidneys may heal on their own. In more severe cases, treatment will involve:  Treating the cause of the kidney injury. This may involve changing any medicines you are taking or adjusting your dosage.  Fluids. You may need specialized IV fluids to balance your body's needs.  Having a catheter placed to drain urine and prevent blockages.  Preventing problems from occurring. This may mean avoiding certain medicines or procedures that can cause further injury to the kidneys. In some cases treatment may also require:  A procedure to remove toxic wastes from the body (dialysis or continuous renal replacement therapy - CRRT).  Surgery. This may be done to repair a torn kidney, or to remove the blockage from the urinary system. Follow these instructions at home: Medicines  Take over-the-counter and prescription medicines only as told by your health care provider.  Do not take any new medicines without your health care provider's approval. Many medicines can worsen your kidney damage.  Do not take any vitamin and mineral supplements without your health care provider's approval. Many nutritional supplements can worsen your kidney damage. Lifestyle  If your health care provider prescribed changes to your diet, follow them. You may need to decrease the amount of protein you eat.  Achieve and maintain a healthy weight. If you need help with this, ask your health care provider.  Start or continue an exercise plan. Try to exercise at  least 30 minutes a day, 5 days a week.  Do not use any tobacco products, such as cigarettes, chewing tobacco, and e-cigarettes. If you need help quitting, ask your health care provider. General instructions  Keep track of your blood pressure. Report changes in your blood pressure as told by your health care provider.  Stay up to date with immunizations. Ask your health care provider which immunizations you need.  Keep all follow-up visits as told by your health care provider. This is important. Where to find more information  American Association of Kidney Patients: BombTimer.gl  National Kidney Foundation: www.kidney.[place]: https://mathis.com/  Life Options Rehabilitation Program: ? www.lifeoptions.org ? www.kidneyschool.org Contact a health care provider if:  Your symptoms get worse.  You develop new symptoms. Get help right away if:  You develop symptoms of worsening kidney disease, which include: ? Headaches. ? Abnormally dark or light skin. ? Easy bruising. ? Frequent hiccups. ? Chest pain. ? Shortness of breath. ? End of menstruation in women. ? Seizures. ? Confusion or altered  mental status. ? Abdominal or back pain. ? Itchiness.  You have a fever.  Your body is producing less urine.  You have pain or bleeding when you urinate. Summary  Acute kidney injury is a sudden worsening of kidney function.  Acute kidney injury can be caused by problems with blood flow to the kidneys, direct damage to the kidneys, and sudden blockage of urine flow.  Symptoms of this condition may not be obvious until it becomes severe. Symptoms may include edema, lethargy, confusion, nausea or vomiting, and problems passing urine.  This condition can usually be diagnosed with blood tests, urine tests, and imaging tests. Sometimes a kidney biopsy is done to diagnose this condition.  Treatment for this condition often involves treating the underlying cause. It is  treated with fluids, medicines, dialysis, diet changes, or surgery. This information is not intended to replace advice given to you by your health care provider. Make sure you discuss any questions you have with your health care provider. Document Revised: 10/17/2017 Document Reviewed: 10/25/2016 Elsevier Patient Education  2020 [employer].

## 2020-05-17 LAB — GLUCOSE, CAPILLARY
Glucose-Capillary: 169 mg/dL — ABNORMAL HIGH (ref 70–99)
Glucose-Capillary: 193 mg/dL — ABNORMAL HIGH (ref 70–99)

## 2020-05-17 NOTE — Progress Notes (Signed)
   Subjective:  O/N Events: None   Patient resting comfortably in bed. She denies new complaints at this time. She states that she is not hungry this morning and feels full. She denies abdominal pain. We discussed the plan of discharge to SNF.   Objective:  Vital signs in last 24 hours: Vitals:   05/16/20 1708 05/16/20 2038 05/17/20 0502 05/17/20 0954  BP: 129/63 117/83 124/80 (!) 133/59  Pulse: 92 85 90 87  Resp: 18 16 15 18   Temp: 98.7 F (37.1 C) 98.2 F (36.8 C) 98.2 F (36.8 C) 98.7 F (37.1 C)  TempSrc: Oral   Oral  SpO2: 99% 98% 91% 97%  Weight:  43.9 kg    Height:       Physical Exam Constitutional:      Appearance: Normal appearance.  Cardiovascular:     Rate and Rhythm: Normal rate and regular rhythm.     Pulses: Normal pulses.     Heart sounds: Normal heart sounds. No murmur heard.  No friction rub. No gallop.   Pulmonary:     Effort: Pulmonary effort is normal.     Breath sounds: Normal breath sounds. No wheezing, rhonchi or rales.  Abdominal:     General: Abdomen is flat. Bowel sounds are normal.     Palpations: Abdomen is soft.     Tenderness: There is no abdominal tenderness.  Neurological:     Mental Status: She is alert.       LABS:  CBC Latest Ref Rng & Units 05/16/2020 05/15/2020 05/14/2020  WBC 4.0 - 10.5 K/uL 9.0 10.8(H) 15.7(H)  Hemoglobin 12.0 - 15.0 g/dL 9.8(L) 9.5(L) 9.6(L)  Hematocrit 36 - 46 % 33.6(L) 32.7(L) 33.6(L)  Platelets 150 - 400 K/uL 346 302 289   BMP Latest Ref Rng & Units 05/16/2020 05/15/2020 05/14/2020  Glucose 70 - 99 mg/dL 233(H) 207(H) 202(H)  BUN 8 - 23 mg/dL 38(H) 33(H) 42(H)  Creatinine 0.44 - 1.00 mg/dL 2.52(H) 2.29(H) 3.00(H)  Sodium 135 - 145 mmol/L 146(H) 143 151(H)  Potassium 3.5 - 5.1 mmol/L 3.4(L) 4.4 3.6  Chloride 98 - 111 mmol/L 113(H) 112(H) 115(H)  CO2 22 - 32 mmol/L 23 19(L) 22  Calcium 8.9 - 10.3 mg/dL 8.0(L) 8.0(L) 8.2(L)    Assessment/Plan:  Active Problems:   Acute renal failure (ARF) (H[facility])    Aspiration pneumonia (H[facility])   Hypernatremia   Goals of care, counseling/discussion   Palliative care by specialist   DNR (do not resuscitate) discussion  86yo female with PMH of intellectual disability, HFpEF, IDA, TIIDM, AKI, upper GI bleed who presented with recurrence of aspiration pneumonia with acute renal injury on chronic kidney disease.    Multifocal Aspiration Pneumonia: - Continue Unasyn and switch to Augmentin when transferred to SNF - Palliative discussion yesterday family agrees to enlist PACE for help in choosing STR.    Acute Renal Failure on CKD Stage III Hypernatremia  Sodium 146 (05/16/2020) - Cr: 2.52 (05/16/2020)    Type II DM  - Continue moderate SSI   IDA PUD - Continue PPI  - Will restart Fe supplementation after infection resolves.    HTN:  - Continue Lopressor 12.5 mg BID.   VTE: heparin  IVF: none Diet: dysphagia 1 Code: full   Dispo: Medically stable for discharge today, pending SNF placement.   Maudie Mercury, MD 05/17/2020, 11:06 AM [phone]

## 2020-05-17 NOTE — Progress Notes (Signed)
Tiffany Bridges to be discharged [facility]  per MD order. Patient verbalized understanding.  Skin clean, dry and intact without evidence of skin break down, no evidence of skin tears noted. IV catheter discontinued intact. Site without signs and symptoms of complications. Dressing and pressure applied. Pt denies pain at the site currently. No complaints noted.  Patient free of lines, drains, and wounds.   Discharge packet assembled. An After Visit Summary (AVS) was printed and given to the EMS personnel. Patient escorted via stretcher and discharged to [facility] via ambulance. Report called to accepting facility; all questions and concerns addressed.   Shela Commons, RN

## 2020-05-17 NOTE — Progress Notes (Signed)
Internal Medicine Attending:   I saw and examined the patient. I reviewed the resident's note and I agree with the resident's findings and plan as documented in the resident's note.  Patient more awake this morning.  She states that she not hungry and denies any complaints currently.  Patient was initially admitted to the hospital with multifocal pneumonia likely secondary to aspiration.  Will transition patient to oral Augmentin today to complete her course of antibiotics.  Patient was also noted to have AKI on CKD stage III as well as hypernatremia secondary to decreased oral intake.  This improved during her hospitalization.  However, patient is at high risk for worsening hyponatremia as well as AKI secondary to her decreased oral intake.  This was discussed with her family by Dr. Gilford Rile yesterday.  No further work-up at this time.  Patient stable for DC to SNF today.

## 2020-05-17 NOTE — TOC Transition Note (Signed)
Transition of Care (TOC) - CM/SW Discharge Note *Discharged to [facility], transported by [facility] - Number for Report: [phone]   Patient Details  Name: Tiffany Bridges MRN: [id number] Date of Birth: September 23, 1934  Transition of Care [facility]) CM/SW Contact:  Tiffany Feil, LCSW Phone Number: 05/17/2020, 1:16 PM   Clinical Narrative:  Patient medically stable for discharge and going to [facility] [place] [facility]. Contact made with [facility] SW Tiffany Bridges [phone]) regarding discharge and they will transport patient. Facility advised of transport and agreeable to [name] being transported by [facility]. Call made to sister Tiffany Bridges [phone]) and HIPPA compliant message left regarding discharge. Patient will be picked up at 2 pm by [facility] and transported to facility, Nurse provided with information to call report.        Final next level of care: [place] [facility] Barriers to Discharge: Barriers Resolved   Patient Goals and CMS Choice Patient states their goals for this hospitalization and ongoing recovery are:: Family agreeable to [place] rehab for patient CMS Medicare.gov Compare Post Acute Care list provided to:: Other (Comment Required) (Sister requested [facility] for patient (SNF has [facility] contract)) Choice offered to / list presented to : Sibling (Sister chose [facility])  Discharge Placement   Existing PASRR number confirmed : 05/16/20          Patient chooses bed at: [facility] Patient to be transferred to facility by: [facility] transport Name of family member notified: Sister Tiffany Bridges [phone]). HIPPA compliant VM left for sister Patient and family notified of of transfer: 05/17/20  Discharge Plan and Services: ST rehab at [place] (SDOH) Interventions  No SDOH interventions requested or needed at discharge.   Readmission Risk  Interventions No flowsheet data found.

## 2020-06-14 ENCOUNTER — Other Ambulatory Visit: Payer: Self-pay | Admitting: Nurse Practitioner

## 2020-06-14 ENCOUNTER — Ambulatory Visit
Admission: RE | Admit: 2020-06-14 | Discharge: 2020-06-14 | Disposition: A | Discharge status: Home / Self Care | Payer: Medicare (Managed Care) | Source: Ambulatory Visit | Attending: Nurse Practitioner | Admitting: Nurse Practitioner

## 2020-06-14 ENCOUNTER — Other Ambulatory Visit: Payer: Self-pay

## 2020-06-14 DIAGNOSIS — R0989 Other specified symptoms and signs involving the circulatory and respiratory systems: Secondary | ICD-10-CM

## 2020-09-15 ENCOUNTER — Emergency Department (HOSPITAL_COMMUNITY): Payer: Medicare (Managed Care)

## 2020-09-15 ENCOUNTER — Inpatient Hospital Stay (HOSPITAL_COMMUNITY): Payer: Medicare (Managed Care)

## 2020-09-15 ENCOUNTER — Inpatient Hospital Stay (HOSPITAL_COMMUNITY)
Admission: EM | Admit: 2020-09-15 | Discharge: 2020-09-20 | DRG: 175 | Disposition: A | Discharge status: Hospice | Payer: Medicare (Managed Care) | Source: Skilled Nursing Facility | Attending: Internal Medicine | Admitting: Internal Medicine

## 2020-09-15 ENCOUNTER — Other Ambulatory Visit: Payer: Self-pay

## 2020-09-15 DIAGNOSIS — R296 Repeated falls: Secondary | ICD-10-CM | POA: Diagnosis present

## 2020-09-15 DIAGNOSIS — I340 Nonrheumatic mitral (valve) insufficiency: Secondary | ICD-10-CM | POA: Diagnosis not present

## 2020-09-15 DIAGNOSIS — Z66 Do not resuscitate: Secondary | ICD-10-CM | POA: Diagnosis present

## 2020-09-15 DIAGNOSIS — E440 Moderate protein-calorie malnutrition: Secondary | ICD-10-CM | POA: Diagnosis present

## 2020-09-15 DIAGNOSIS — R627 Adult failure to thrive: Secondary | ICD-10-CM | POA: Diagnosis present

## 2020-09-15 DIAGNOSIS — Z7189 Other specified counseling: Secondary | ICD-10-CM

## 2020-09-15 DIAGNOSIS — I5032 Chronic diastolic (congestive) heart failure: Secondary | ICD-10-CM | POA: Diagnosis present

## 2020-09-15 DIAGNOSIS — F79 Unspecified intellectual disabilities: Secondary | ICD-10-CM | POA: Diagnosis present

## 2020-09-15 DIAGNOSIS — R531 Weakness: Secondary | ICD-10-CM | POA: Diagnosis not present

## 2020-09-15 DIAGNOSIS — R1310 Dysphagia, unspecified: Secondary | ICD-10-CM | POA: Diagnosis present

## 2020-09-15 DIAGNOSIS — E11649 Type 2 diabetes mellitus with hypoglycemia without coma: Secondary | ICD-10-CM | POA: Diagnosis present

## 2020-09-15 DIAGNOSIS — D649 Anemia, unspecified: Secondary | ICD-10-CM

## 2020-09-15 DIAGNOSIS — I313 Pericardial effusion (noninflammatory): Secondary | ICD-10-CM | POA: Diagnosis present

## 2020-09-15 DIAGNOSIS — Q261 Persistent left superior vena cava: Secondary | ICD-10-CM

## 2020-09-15 DIAGNOSIS — I361 Nonrheumatic tricuspid (valve) insufficiency: Secondary | ICD-10-CM | POA: Diagnosis not present

## 2020-09-15 DIAGNOSIS — R0602 Shortness of breath: Secondary | ICD-10-CM

## 2020-09-15 DIAGNOSIS — I2699 Other pulmonary embolism without acute cor pulmonale: Secondary | ICD-10-CM | POA: Diagnosis not present

## 2020-09-15 DIAGNOSIS — N3289 Other specified disorders of bladder: Secondary | ICD-10-CM | POA: Diagnosis present

## 2020-09-15 DIAGNOSIS — R4182 Altered mental status, unspecified: Secondary | ICD-10-CM | POA: Diagnosis present

## 2020-09-15 DIAGNOSIS — D509 Iron deficiency anemia, unspecified: Secondary | ICD-10-CM | POA: Diagnosis present

## 2020-09-15 DIAGNOSIS — R451 Restlessness and agitation: Secondary | ICD-10-CM | POA: Diagnosis present

## 2020-09-15 DIAGNOSIS — E1122 Type 2 diabetes mellitus with diabetic chronic kidney disease: Secondary | ICD-10-CM | POA: Diagnosis present

## 2020-09-15 DIAGNOSIS — R601 Generalized edema: Secondary | ICD-10-CM | POA: Diagnosis not present

## 2020-09-15 DIAGNOSIS — Z789 Other specified health status: Secondary | ICD-10-CM | POA: Diagnosis not present

## 2020-09-15 DIAGNOSIS — I130 Hypertensive heart and chronic kidney disease with heart failure and stage 1 through stage 4 chronic kidney disease, or unspecified chronic kidney disease: Secondary | ICD-10-CM | POA: Diagnosis present

## 2020-09-15 DIAGNOSIS — E119 Type 2 diabetes mellitus without complications: Secondary | ICD-10-CM

## 2020-09-15 DIAGNOSIS — R54 Age-related physical debility: Secondary | ICD-10-CM | POA: Diagnosis present

## 2020-09-15 DIAGNOSIS — N184 Chronic kidney disease, stage 4 (severe): Secondary | ICD-10-CM

## 2020-09-15 DIAGNOSIS — Z79899 Other long term (current) drug therapy: Secondary | ICD-10-CM

## 2020-09-15 DIAGNOSIS — Z681 Body mass index (BMI) 19 or less, adult: Secondary | ICD-10-CM | POA: Diagnosis not present

## 2020-09-15 DIAGNOSIS — I2609 Other pulmonary embolism with acute cor pulmonale: Secondary | ICD-10-CM | POA: Diagnosis present

## 2020-09-15 DIAGNOSIS — F419 Anxiety disorder, unspecified: Secondary | ICD-10-CM | POA: Diagnosis present

## 2020-09-15 DIAGNOSIS — Z7984 Long term (current) use of oral hypoglycemic drugs: Secondary | ICD-10-CM

## 2020-09-15 DIAGNOSIS — Z515 Encounter for palliative care: Secondary | ICD-10-CM

## 2020-09-15 DIAGNOSIS — N281 Cyst of kidney, acquired: Secondary | ICD-10-CM | POA: Diagnosis present

## 2020-09-15 DIAGNOSIS — T17908A Unspecified foreign body in respiratory tract, part unspecified causing other injury, initial encounter: Secondary | ICD-10-CM

## 2020-09-15 DIAGNOSIS — E870 Hyperosmolality and hypernatremia: Secondary | ICD-10-CM | POA: Diagnosis present

## 2020-09-15 DIAGNOSIS — J690 Pneumonitis due to inhalation of food and vomit: Secondary | ICD-10-CM | POA: Diagnosis present

## 2020-09-15 DIAGNOSIS — D61818 Other pancytopenia: Secondary | ICD-10-CM | POA: Diagnosis present

## 2020-09-15 DIAGNOSIS — Z794 Long term (current) use of insulin: Secondary | ICD-10-CM

## 2020-09-15 DIAGNOSIS — Z20822 Contact with and (suspected) exposure to covid-19: Secondary | ICD-10-CM | POA: Diagnosis present

## 2020-09-15 LAB — COMPREHENSIVE METABOLIC PANEL
ALT: 20 U/L (ref 0–44)
AST: 21 U/L (ref 15–41)
Albumin: 2.3 g/dL — ABNORMAL LOW (ref 3.5–5.0)
Alkaline Phosphatase: 207 U/L — ABNORMAL HIGH (ref 38–126)
Anion gap: 11 (ref 5–15)
BUN: 46 mg/dL — ABNORMAL HIGH (ref 8–23)
CO2: 25 mmol/L (ref 22–32)
Calcium: 8.3 mg/dL — ABNORMAL LOW (ref 8.9–10.3)
Chloride: 111 mmol/L (ref 98–111)
Creatinine, Ser: 2.58 mg/dL — ABNORMAL HIGH (ref 0.44–1.00)
GFR, Estimated: 18 mL/min — ABNORMAL LOW (ref >60)
Glucose, Bld: 84 mg/dL (ref 70–99)
Potassium: 4.4 mmol/L (ref 3.5–5.1)
Sodium: 147 mmol/L — ABNORMAL HIGH (ref 135–145)
Total Bilirubin: 0.4 mg/dL (ref 0.3–1.2)
Total Protein: 5.5 g/dL — ABNORMAL LOW (ref 6.5–8.1)

## 2020-09-15 LAB — CBC WITH DIFFERENTIAL/PLATELET
Abs Immature Granulocytes: 0.02 10*3/uL (ref 0.00–0.07)
Basophils Absolute: 0 10*3/uL (ref 0.0–0.1)
Basophils Relative: 0 %
Eosinophils Absolute: 0.1 10*3/uL (ref 0.0–0.5)
Eosinophils Relative: 4 %
HCT: 31.7 % — ABNORMAL LOW (ref 36.0–46.0)
Hemoglobin: 8.9 g/dL — ABNORMAL LOW (ref 12.0–15.0)
Immature Granulocytes: 1 %
Lymphocytes Relative: 43 %
Lymphs Abs: 1.1 10*3/uL (ref 0.7–4.0)
MCH: 22.9 pg — ABNORMAL LOW (ref 26.0–34.0)
MCHC: 28.1 g/dL — ABNORMAL LOW (ref 30.0–36.0)
MCV: 81.7 fL (ref 80.0–100.0)
Monocytes Absolute: 0.3 10*3/uL (ref 0.1–1.0)
Monocytes Relative: 14 %
Neutro Abs: 1 10*3/uL — ABNORMAL LOW (ref 1.7–7.7)
Neutrophils Relative %: 38 %
Platelets: 151 10*3/uL (ref 150–400)
RBC: 3.88 MIL/uL (ref 3.87–5.11)
RDW: 16.7 % — ABNORMAL HIGH (ref 11.5–15.5)
WBC: 2.5 10*3/uL — ABNORMAL LOW (ref 4.0–10.5)
nRBC: 0 % (ref 0.0–0.2)

## 2020-09-15 LAB — URINALYSIS, COMPLETE (UACMP) WITH MICROSCOPIC
Bilirubin Urine: NEGATIVE
Glucose, UA: NEGATIVE mg/dL
Ketones, ur: NEGATIVE mg/dL
Nitrite: NEGATIVE
Protein, ur: 100 mg/dL — AB
Specific Gravity, Urine: 1.006 (ref 1.005–1.030)
pH: 6 (ref 5.0–8.0)

## 2020-09-15 LAB — ECHOCARDIOGRAM COMPLETE
Area-P 1/2: 4.68 cm2
S' Lateral: 2.8 cm
Weight: 1664.91 oz

## 2020-09-15 LAB — RESPIRATORY PANEL BY RT PCR (FLU A&B, COVID)
Influenza A by PCR: NEGATIVE
Influenza B by PCR: NEGATIVE
SARS Coronavirus 2 by RT PCR: NEGATIVE

## 2020-09-15 LAB — HEMOGLOBIN A1C
Hgb A1c MFr Bld: 7.3 % — ABNORMAL HIGH (ref 4.8–5.6)
Mean Plasma Glucose: 163 mg/dL

## 2020-09-15 LAB — GLUCOSE, CAPILLARY
Glucose-Capillary: 57 mg/dL — ABNORMAL LOW (ref 70–99)
Glucose-Capillary: 171 mg/dL — ABNORMAL HIGH (ref 70–99)

## 2020-09-15 LAB — TROPONIN I (HIGH SENSITIVITY)
Troponin I (High Sensitivity): 55 ng/L — ABNORMAL HIGH (ref <18)
Troponin I (High Sensitivity): 49 ng/L — ABNORMAL HIGH (ref <18)

## 2020-09-15 LAB — CBG MONITORING, ED
Glucose-Capillary: 47 mg/dL — ABNORMAL LOW (ref 70–99)
Glucose-Capillary: 155 mg/dL — ABNORMAL HIGH (ref 70–99)

## 2020-09-15 LAB — BRAIN NATRIURETIC PEPTIDE: B Natriuretic Peptide: >4500 pg/mL — ABNORMAL HIGH (ref 0.0–100.0)

## 2020-09-15 LAB — SODIUM, URINE, RANDOM: Sodium, Ur: 123 mmol/L

## 2020-09-15 MED ORDER — FUROSEMIDE 10 MG/ML IJ SOLN
40.0000 mg | Freq: Once | INTRAMUSCULAR | Status: AC
  Administered 2020-09-15: 40 mg via INTRAVENOUS
  Filled 2020-09-15: qty 4

## 2020-09-15 MED ORDER — HYDRALAZINE HCL 20 MG/ML IJ SOLN
5.0000 mg | Freq: Once | INTRAMUSCULAR | Status: AC
  Administered 2020-09-15: 5 mg via INTRAVENOUS

## 2020-09-15 MED ORDER — HEPARIN SODIUM (PORCINE) 5000 UNIT/ML IJ SOLN
5000.0000 [IU] | Freq: Three times a day (TID) | INTRAMUSCULAR | Status: DC
  Administered 2020-09-15: 5000 [IU] via SUBCUTANEOUS
  Filled 2020-09-15: qty 1

## 2020-09-15 MED ORDER — ALBUTEROL SULFATE (2.5 MG/3ML) 0.083% IN NEBU: 2.5000 mg | INHALATION_SOLUTION | RESPIRATORY_TRACT | Status: DC | PRN

## 2020-09-15 MED ORDER — DEXTROSE 50 % IV SOLN: 1.0000 | INTRAVENOUS | Status: DC | PRN

## 2020-09-15 MED ORDER — PANTOPRAZOLE SODIUM 40 MG PO TBEC: 40.0000 mg | DELAYED_RELEASE_TABLET | Freq: Two times a day (BID) | ORAL | Status: DC

## 2020-09-15 MED ORDER — DEXTROSE 50 % IV SOLN
1.0000 | Freq: Once | INTRAVENOUS | Status: AC
  Administered 2020-09-15: 50 mL via INTRAVENOUS
  Filled 2020-09-15: qty 50

## 2020-09-15 MED ORDER — LORAZEPAM 2 MG/ML IJ SOLN
1.0000 mg | INTRAMUSCULAR | Status: DC | PRN
  Administered 2020-09-15 – 2020-09-20 (×7): 1 mg via INTRAVENOUS
  Filled 2020-09-15 (×7): qty 1

## 2020-09-15 MED ORDER — DEXTROSE 250 MG/ML IV SOLN: 25.0000 mg | Freq: Once | INTRAVENOUS | Status: DC

## 2020-09-15 MED ORDER — ACETAMINOPHEN 650 MG RE SUPP
650.0000 mg | Freq: Four times a day (QID) | RECTAL | Status: DC | PRN
  Filled 2020-09-15: qty 1

## 2020-09-15 MED ORDER — HEPARIN (PORCINE) 25000 UT/250ML-% IV SOLN
600.0000 [IU]/h | INTRAVENOUS | Status: DC
  Administered 2020-09-15: 800 [IU]/h via INTRAVENOUS
  Administered 2020-09-17 – 2020-09-18 (×2): 600 [IU]/h via INTRAVENOUS
  Filled 2020-09-15 (×3): qty 250

## 2020-09-15 MED ORDER — PANTOPRAZOLE SODIUM 40 MG IV SOLR
40.0000 mg | Freq: Two times a day (BID) | INTRAVENOUS | Status: DC
  Administered 2020-09-15 – 2020-09-17 (×4): 40 mg via INTRAVENOUS
  Filled 2020-09-15 (×4): qty 40

## 2020-09-15 MED ORDER — HYDRALAZINE HCL 20 MG/ML IJ SOLN
INTRAMUSCULAR | Status: AC
  Filled 2020-09-15: qty 1

## 2020-09-15 MED ORDER — INSULIN ASPART 100 UNIT/ML ~~LOC~~ SOLN: 0.0000 [IU] | Freq: Three times a day (TID) | SUBCUTANEOUS | Status: DC

## 2020-09-15 MED ORDER — ACETAMINOPHEN 325 MG PO TABS: 650.0000 mg | ORAL_TABLET | Freq: Four times a day (QID) | ORAL | Status: DC | PRN

## 2020-09-15 MED ORDER — POLYETHYLENE GLYCOL 3350 17 G PO PACK: 17.0000 g | PACK | Freq: Every day | ORAL | Status: DC | PRN

## 2020-09-15 MED ORDER — LORAZEPAM 2 MG/ML IJ SOLN: 1.0000 mg | INTRAMUSCULAR | Status: DC | PRN

## 2020-09-15 MED ORDER — DEXTROSE 50 % IV SOLN: 50.0000 mL | Freq: Once | INTRAVENOUS | Status: AC

## 2020-09-15 MED ORDER — DEXTROSE 50 % IV SOLN
INTRAVENOUS | Status: AC
  Administered 2020-09-15: 25 mL via INTRAVENOUS
  Filled 2020-09-15: qty 50

## 2020-09-15 MED ORDER — INSULIN ASPART 100 UNIT/ML ~~LOC~~ SOLN: 0.0000 [IU] | Freq: Every day | SUBCUTANEOUS | Status: DC

## 2020-09-15 MED ORDER — HYDRALAZINE HCL 20 MG/ML IJ SOLN
5.0000 mg | Freq: Once | INTRAMUSCULAR | Status: AC
  Administered 2020-09-15: 5 mg via INTRAVENOUS
  Filled 2020-09-15: qty 1

## 2020-09-15 NOTE — Consult Note (Signed)
Consultation Note Date: 09/15/2020   Patient Name: Tiffany Bridges  DOB: 12/05/1933  MRN: [id number]  Age / Sex: 86 y.o., female  PCP: Willene Hatchet, NP Referring Physician: Lucious Groves, DO  Reason for Consultation: Establishing goals of care  HPI/Patient Profile: 86 y.o. female  with past medical history of intellectual disability, diabetes mellitus type 2, CKD, CHF, iron deficiency anemia, and dysphagia presented to the ED on 09/15/20 from [place] for right sided facial and breast swelling.  ED course: Patient remained asymptomatic on arrival.  Her vitals were stable.  However she had mild fascial swelling (periorbital edema and swelling of right cheek) on exam as well as rt breast swelling and bilateral feet swelling. Significant lab results: Sodium 147, alkaline phosphatase 207, WBC 2.5, hemoglobin 8.9, BNP more than 4500, high-sensitivity troponin 55.  CT chest without contrast showed anasarca, volume overload, right more than left pleural effusion.  Also pericardial effusion (nearly 2 cm along the inferior heart), asymmetric severe right side chest wall swelling persistent left SVC. She is being admitted for anasarca due to cor pulmonale suspected due to pulmonary embolism and work up for suspicious finding on prior bladder ultrasound.  Of note, patient has been seen by PMT in June of this year. She is also being followed by [facility].  Patient and family face treatment option decisions, advanced directive decisions, and anticipatory care needs.   Clinical Assessment and Goals of Care: I have reviewed medical records including EPIC notes, labs, and imaging. Noted patient's albumin was 2.3 on 09/15/20. Received report from primary RN - no acute concerns. States patient is still NPO pending SLP evaluation for history of dysphagia.   Went to visit patient at bedside - no family/visitors  present. Patient was lying in bed with her head under the blankets. She did respond to voice and gentle touch - when asked about pain, she stated she had "a little pain in my stomach" because she was "hungry." Patient began to continually cry out and become restless - asking me to bring her something to eat - I explained we would get her something as soon as we knew it was safe for her to [place]. No respiratory distress, increased work of breathing, or secretions noted.   RN reported swabbing the patient's mouth earlier when she was asking for something to drink. Discussed that patient has been agitated.    4:26 PM Attempted to call sister/[name] to discuss diagnosis, prognosis, GOC, EOL wishes, disposition, and options for patient - no answer - voicemail box was full and was not able to leave a message. Will continue to try and reach sister for [place] discussion.  I called [facility] - I asked if they had any HCPOA records on file for patient. They stated they do not but do have DNR form. I was told sister/[name] is/has been the patient's caregiver and is the correct person to continue speaking with for GOC/medical decisions. I appreciate their assistance.   Primary Decision Maker: NEXT  OF KIN [name] Diane Hanel - sister    SUMMARY OF RECOMMENDATIONS:  Continue current medical treatment until [place] discussion can be had with family  Continue DNR/DNI as previously documented - [facility] confirmed DNR/DNI status  PMT will continue to attempt contact with [name]/sister for [place] discussion tomorrow 10/30  Ativan 1mg  IV q4hr PRN anxiety/agitation   PMT will continue to follow holistically  Code Status/Advance Care Planning:  DNR  Palliative Prophylaxis:   Aspiration, Bowel Regimen, Delirium Protocol, Frequent Pain Assessment, Oral Care and Turn Reposition  Additional Recommendations (Limitations, Scope, Preferences):  Full Scope Treatment  Psycho-social/Spiritual:   Desire for  further Chaplaincy support: patient unable to verbalize Created space and opportunity for patient to express thoughts and feelings regarding patient's current medical situation.   Emotional support provided.  Prognosis:   < 6 months  Discharge Planning: To Be Determined      Primary Diagnoses: Present on Admission: . Anasarca . Hypernatremia . CKD (chronic kidney disease) stage 4, GFR 15-29 ml/min (HCC)   I have reviewed the medical record, interviewed the patient and family, and examined the patient. The following aspects are pertinent.  Past Medical History:  Diagnosis Date  . (HFpEF) heart failure with preserved ejection fraction ([place])   . Anemia, iron deficiency   . CKD (chronic kidney disease) stage 3, GFR 30-59 ml/min   . Diabetes [facility])    Social History   Socioeconomic History  . Marital status: Single    Spouse name: Not on file  . Number of children: Not on file  . Years of education: Not on file  . Highest education level: Not on file  Occupational History  . Not on file  Tobacco Use  . Smoking status: Never Smoker  . Smokeless tobacco: Never Used  Vaping Use  . Vaping Use: Never used  Substance and Sexual Activity  . Alcohol use: Not on file  . Drug use: Not Currently  . Sexual activity: Not Currently  Other Topics Concern  . Not on file  Social History Narrative  . Not on file   Social Determinants of Health   Financial Resource Strain:   . Difficulty of Paying Living Expenses: Not on file  Food Insecurity:   . Worried About Charity fundraiser in the Last Year: Not on file  . Ran Out of Food in the Last Year: Not on file  Transportation Needs:   . Lack of Transportation (Medical): Not on file  . Lack of Transportation (Non-Medical): Not on file  Physical Activity:   . Days of Exercise per Week: Not on file  . Minutes of Exercise per Session: Not on file  Stress:   . Feeling of Stress : Not on file  Social Connections:   . Frequency of  Communication with Friends and Family: Not on file  . Frequency of Social Gatherings with Friends and Family: Not on file  . Attends Religious Services: Not on file  . Active Member of Clubs or Organizations: Not on file  . Attends Archivist Meetings: Not on file  . Marital Status: Not on file   No family history on file. Scheduled Meds: . pantoprazole  40 mg Oral BID   Continuous Infusions: . heparin 800 Units/hr (09/15/20 1440)   PRN Meds:.acetaminophen **OR** acetaminophen, albuterol, polyethylene glycol Medications Prior to Admission:  Prior to Admission medications   Medication Sig Start Date End Date Taking? Authorizing Provider  acetaminophen (TYLENOL) 325 MG tablet Take 650 mg  by mouth every 6 (six) hours as needed for mild pain.   Yes [provider]  epoetin alfa-epbx (RETACRIT) 54008 UNIT/ML injection Inject 10,000 Units into the skin every 14 (fourteen) days.   Yes [provider]  ferrous sulfate 325 (65 FE) MG tablet Take 325 mg by mouth daily with breakfast.   Yes [provider]  furosemide (LASIX) 20 MG tablet Take 1 tablet (20 mg total) by mouth daily as needed for edema (weight gain >3lbs, shortness of breath, lower extremity swelling). 05/16/20  Yes Maudie Mercury, MD  glipiZIDE (GLUCOTROL) 5 MG tablet Take 2.5 mg by mouth daily before breakfast.   Yes [provider]  Glucerna (GLUCERNA) LIQD Take 1 Can by mouth in the morning and at bedtime.   Yes [provider]  ipratropium-albuterol (DUONEB) 0.5-2.5 (3) MG/3ML SOLN Take 3 mLs by nebulization 4 (four) times daily as needed (For wheezing, Shortness of breath).   Yes [provider]  linagliptin (TRADJENTA) 5 MG TABS tablet Take 5 mg by mouth daily.   Yes [provider]  Maltodextrin-Xanthan Gum ([place]) POWD Take 1 Can by mouth See admin instructions. Measure recommended amount of thickener. Add to beverage of choice to make  nectar like consistency for all liquids.   Yes [provider]  metoprolol succinate (TOPROL-XL) 25 MG 24 hr tablet Take 25 mg by mouth daily.   Yes [provider]  Multiple Vitamins-Minerals (CENTRUM ADULTS PO) Take 1 tablet by mouth daily.   Yes [provider]  oxybutynin (DITROPAN) 5 MG tablet Take 5 mg by mouth at bedtime.   Yes [provider]  pantoprazole (PROTONIX) 40 MG tablet Take 40 mg by mouth 2 (two) times daily.   Yes [provider]  sennosides-docusate sodium (SENOKOT-S) 8.6-50 MG tablet Take 1 tablet by mouth daily.   Yes [provider]   No Known Allergies Review of Systems  Unable to perform ROS: Other  intellectual disability  Physical Exam Constitutional:      General: She is not in acute distress.    Appearance: She is ill-appearing.  HENT:     Head:     Jaw: Swelling (right) present.  Pulmonary:     Effort: No respiratory distress.  Musculoskeletal:     Right lower leg: Edema present.     Left lower leg: Edema present.  Skin:    General: Skin is warm and dry.  Neurological:     Mental Status: She is alert.     Motor: Weakness present.  Psychiatric:        Mood and Affect: Mood is anxious.        Behavior: Behavior is agitated.     Vital Signs: BP (!) 194/90   Pulse 76   Temp 98 F (36.7 C) (Rectal)   Resp 18   Ht 5\' 6"  (1.676 m)   Wt 47.2 kg   SpO2 96%   BMI 16.80 kg/m  Pain Scale: 0-10   Pain Score: 0-No pain   SpO2: SpO2: 96 % O2 Device:SpO2: 96 % O2 Flow Rate: .   IO: Intake/output summary: No intake or output data in the 24 hours ending 09/15/20 1544  LBM:   Baseline Weight: Weight: 47.2 kg Most recent weight: Weight: 47.2 kg     Palliative Assessment/Data: PPS 50%     Time In: 1545 Time Out: 1635 Time Total: 50 minutes Greater than 50%  of this time was spent counseling and coordinating care  related to the above assessment and plan.  Signed by: Lin Landsman, NP    Please contact Palliative Medicine Team phone at [phone] for questions and concerns.  For individual provider: See Shea Evans

## 2020-09-15 NOTE — ED Notes (Signed)
Notified dr. Myrtie Hawk of pt's bp and notified her I gave dextrose 25mg  IV and will recheck bp in 15 mins and will give the other half if still low

## 2020-09-15 NOTE — ED Notes (Signed)
Pt wet and has BM; pt cleaned and dried; new gown and linens applied; placed in position of comfort.

## 2020-09-15 NOTE — ED Triage Notes (Signed)
Pt bib Ems from [place]. They called out for R sided facial swelling and R sided breast swelling. Facility states that around 630 this evening they fed the patient and around 9pm they went back in to get her changed and noted the swelling. Pt has a hx of CHF. Pt has dysphagia at baseline as well as intellectual disability. Pt denies any pain at this time.

## 2020-09-15 NOTE — Progress Notes (Signed)
  Echocardiogram 2D Echocardiogram has been performed.  [name] G [name] 09/15/2020, 1:30 PM

## 2020-09-15 NOTE — Progress Notes (Signed)
I received call from NP Cletis Athens from [place], passing along the message that Ms. Linnen's 84 yo sister and primary CG, Ms. [name], received a phone message that her presence was requested at the hospital.  Ms. Tamala Julian wants hospital staff to know that she has no transportation options.  Ms. Tamala Julian returned a call to the hospital - it may have been to Palliative Care - though her call was not answered.  Ms. Thompson Caul home # is [phone] and cell # is [phone].  Ms. Milazzo has just recently been permanently placed; she was living with sister [name] who was caring for both Ms. Dickerson and Ms. Streater's ill daughter.  When she couldn't manage both, the decision was made for Ms. Orlich's placement. *I understand that Ms. Chapdelaine's daughter has recently passed away and that Ms. Avakian has not yet been informed!    NP Cletis Athens has assumed the role of PCP upon Ms. Andringa's placement.  Prior to that, her PCP was Dr[facility] Farrel Gordon at [facility].    Dr. Dorian Pod (formerly with [facility])

## 2020-09-15 NOTE — Progress Notes (Signed)
  Echocardiogram 2D Echocardiogram has been attempted. Patient receiving personal care. Will reattempt at later time.  Tiffany Bridges 09/15/2020, 8:01 AM

## 2020-09-15 NOTE — ED Notes (Signed)
Notified Dr. Early Chars of pt's bp and obtained orders for bp meds IV to be given.

## 2020-09-15 NOTE — ED Notes (Signed)
Pt has 3+ pitting edema of left foot, 4+ pitting edema of right foot and 2+ left cheek swelling.

## 2020-09-15 NOTE — ED Notes (Signed)
Paged IM to RN Alana--Tyla Burgner

## 2020-09-15 NOTE — ED Provider Notes (Signed)
[facility] EMERGENCY DEPARTMENT Provider Note   CSN: [id number] Arrival date & time: 09/15/20  1914     History Chief Complaint  Patient presents with  . Facial Swelling    Tiffany Bridges is a 86 y.o. female.  Apparently ate supper and shortly after was checked on. Asymptomatic however staff felt that her right face and upper chest were more edematous than previous. No symptoms. Patient transported here with normal VS. Still asymptomatic.         Past Medical History:  Diagnosis Date  . (HFpEF) heart failure with preserved ejection fraction ([place])   . Anemia, iron deficiency   . CKD (chronic kidney disease) stage 3, GFR 30-59 ml/min   . Diabetes [facility])     Patient Active Problem List   Diagnosis Date Noted  . Goals of care, counseling/discussion   . Palliative care by specialist   . DNR (do not resuscitate) discussion   . Aspiration pneumonia ([facility])   . Hypernatremia   . Acute renal failure (ARF) ([place]) 05/10/2020    No past surgical history on file.   OB History   No obstetric history on file.     No family history on file.  Social History   Tobacco Use  . Smoking status: Never Smoker  . Smokeless tobacco: Never Used  Vaping Use  . Vaping Use: Never used  Substance Use Topics  . Alcohol use: Not on file  . Drug use: Not Currently    Home Medications Prior to Admission medications   Medication Sig Start Date End Date Taking? Authorizing Provider  acetaminophen (TYLENOL) 325 MG tablet Take 650 mg by mouth every 6 (six) hours as needed for mild pain.   Yes [provider]  epoetin alfa-epbx (RETACRIT) 78295 UNIT/ML injection Inject 10,000 Units into the skin every 14 (fourteen) days.   Yes [provider]  ferrous sulfate 325 (65 FE) MG tablet Take 325 mg by mouth daily with breakfast.   Yes [provider]  furosemide (LASIX) 20 MG tablet Take 1 tablet (20 mg total) by mouth daily as needed for edema (weight  gain >3lbs, shortness of breath, lower extremity swelling). 05/16/20  Yes Maudie Mercury, MD  glipiZIDE (GLUCOTROL) 5 MG tablet Take 2.5 mg by mouth daily before breakfast.   Yes [provider]  Glucerna (GLUCERNA) LIQD Take 1 Can by mouth in the morning and at bedtime.   Yes [provider]  ipratropium-albuterol (DUONEB) 0.5-2.5 (3) MG/3ML SOLN Take 3 mLs by nebulization 4 (four) times daily as needed (For wheezing, Shortness of breath).   Yes [provider]  linagliptin (TRADJENTA) 5 MG TABS tablet Take 5 mg by mouth daily.   Yes [provider]  Maltodextrin-Xanthan Gum ([place]) POWD Take 1 Can by mouth See admin instructions. Measure recommended amount of thickener. Add to beverage of choice to make nectar like consistency for all liquids.   Yes [provider]  metoprolol succinate (TOPROL-XL) 25 MG 24 hr tablet Take 25 mg by mouth daily.   Yes [provider]  Multiple Vitamins-Minerals (CENTRUM ADULTS PO) Take 1 tablet by mouth daily.   Yes [provider]  oxybutynin (DITROPAN) 5 MG tablet Take 5 mg by mouth at bedtime.   Yes [provider]  pantoprazole (PROTONIX) 40 MG tablet Take 40 mg by mouth 2 (two) times daily.   Yes [provider]  sennosides-docusate sodium (SENOKOT-S) 8.6-50 MG tablet Take 1 tablet by mouth daily.  Yes [provider]    Allergies    Patient has no known allergies.  Review of Systems   Review of Systems  All other systems reviewed and are negative.   Physical Exam Updated Vital Signs BP (!) 185/97   Pulse 68   Temp 98 F (36.7 C) (Rectal)   Resp (!) 23   SpO2 96%   Physical Exam Vitals and nursing note reviewed.  Constitutional:      Appearance: She is well-developed.  HENT:     Head: Normocephalic and atraumatic.     Comments: Mild periorbital edema and edema on right cheek    Mouth/Throat:     Mouth: Mucous membranes are moist.    Eyes:     Pupils: Pupils are equal, round, and reactive to light.  Cardiovascular:     Rate and Rhythm: Normal rate and regular rhythm.  Pulmonary:     Effort: No respiratory distress.     Breath sounds: No stridor.  Abdominal:     General: There is no distension.  Musculoskeletal:        General: No swelling or tenderness. Normal range of motion.     Cervical back: Normal range of motion.  Skin:    General: Skin is dry.  Neurological:     General: No focal deficit present.     Mental Status: She is alert.     ED Results / Procedures / Treatments   Labs (all labs ordered are listed, but only abnormal results are displayed) Labs Reviewed  CBC WITH DIFFERENTIAL/PLATELET - Abnormal; Notable for the following components:      Result Value   WBC 2.5 (*)    Hemoglobin 8.9 (*)    HCT 31.7 (*)    MCH 22.9 (*)    MCHC 28.1 (*)    RDW 16.7 (*)    Neutro Abs 1.0 (*)    All other components within normal limits  COMPREHENSIVE METABOLIC PANEL - Abnormal; Notable for the following components:   Sodium 147 (*)    BUN 46 (*)    Creatinine, Ser 2.58 (*)    Calcium 8.3 (*)    Total Protein 5.5 (*)    Albumin 2.3 (*)    Alkaline Phosphatase 207 (*)    GFR, Estimated 18 (*)    All other components within normal limits  BRAIN NATRIURETIC PEPTIDE - Abnormal; Notable for the following components:   B Natriuretic Peptide >4,500.0 (*)    All other components within normal limits  TROPONIN I (HIGH SENSITIVITY) - Abnormal; Notable for the following components:   Troponin I (High Sensitivity) 55 (*)    All other components within normal limits  RESPIRATORY PANEL BY RT PCR (FLU A&B, COVID)  TROPONIN I (HIGH SENSITIVITY)    EKG EKG Interpretation  Date/Time:  Friday September 15 2020 00:54:09 EDT Ventricular Rate:  74 PR Interval:    QRS Duration: 83 QT Interval:  430 QTC Calculation: 478 R Axis:   -116 Text Interpretation: Sinus rhythm Left anterior fascicular block Abnormal R-wave  progression, early transition Borderline abnrm T, anterolateral leads somewhat decreased amplitude of QRS's in a few leads Confirmed by Merrily Pew [id number]) on 09/15/2020 6:17:26 AM   Radiology CT CHEST WO CONTRAST  Result Date: 09/15/2020 CLINICAL DATA:  Chest pain or shortness of breath with pleurisy for effusion suspected EXAM: CT CHEST WITHOUT CONTRAST TECHNIQUE: Multidetector CT imaging of the chest was performed following the standard protocol without IV contrast. COMPARISON:  05/10/2020 FINDINGS: Cardiovascular:  Cardiomegaly with pericardial effusion greatest along the inferior heart where thickness is 18 mm. Aortic and coronary atherosclerosis. Persistent left SVC. Mediastinum/Nodes: Negative for adenopathy or mass. Lungs/Pleura: Moderate right and small left pleural effusion. Dependent atelectasis. Airway thickening which may be congestive cuffing. Incidental tracheal diverticulum at the thoracic inlet. Upper Abdomen: No acute finding Musculoskeletal: Marked anasarca. There is asymmetric edema in the right breast without visible operative change or mass at the right axilla. Spondylosis. No acute osseous finding. IMPRESSION: Volume overload: *Right more than left pleural effusion, moderate on the right. *Pericardial effusion measuring nearly 2 cm in thickness along the inferior heart. *Anasarca with asymmetric severe right-sided chest wall swelling. There is a persistent left SVC, but no right brachiocephalic vein compression. No visible axillary mass. Electronically Signed   By: Monte Fantasia M.D.   On: 09/15/2020 04:16   DG Chest Portable 1 View  Result Date: 09/15/2020 CLINICAL DATA:  Right-sided facial swelling and right-sided breast swelling. EXAM: PORTABLE CHEST 1 VIEW COMPARISON:  June 14, 2020 FINDINGS: Mild atelectasis and/or infiltrate is seen within the right lung base. This is decreased in severity when compared to the prior study. A more focal opacity is seen overlying the lateral  aspect of the right lung base. There is no evidence of a pneumothorax. Moderate to marked severity enlargement of the cardiac silhouette is seen. The visualized skeletal structures are unremarkable. IMPRESSION: 1. Cardiomegaly with mild right basilar atelectasis and/or infiltrate. 2. Focal opacity overlying the lateral aspect of the right lung base which may represent a small amount of focal infiltrate. The presence of a loculated right-sided pleural effusion or pleural mass cannot be excluded. Further evaluation with chest CT is recommended. Electronically Signed   By: Virgina Norfolk M.D.   On: 09/15/2020 02:31    Procedures Procedures (including critical care time)  Medications Ordered in ED Medications  furosemide (LASIX) injection 40 mg (40 mg Intravenous Given 09/15/20 0513)    ED Course  I have reviewed the triage vital signs and the nursing notes.  Pertinent labs & imaging results that were available during my care of the patient were reviewed by me and considered in my medical decision making (see chart for details).    MDM Rules/Calculators/A&P                         Dependent edema? Less likely SVC syndrome, allergic, infectious.   xr with RLL abnormalities, will ct. Her GFR is low so can't use contrast at this time.  CT shows anasarca, pericardial effusion.  She not tamponade based on her vital signs so we will give a dose of Lasix and discussed with medicine for admission.  Final Clinical Impression(s) / ED Diagnoses Final diagnoses:  Anasarca    Rx / DC Orders ED Discharge Orders    None       Valyncia Wiens, Corene Cornea, MD 09/15/20 [phone]

## 2020-09-15 NOTE — ED Notes (Addendum)
Pt has 3+ edema of left leg, 2+ right leg, 4+ left foot, 3+ right foot, 2+ left cheek.

## 2020-09-15 NOTE — Progress Notes (Signed)
ANTICOAGULATION CONSULT NOTE - Initial Consult  Pharmacy Consult for heparin gtt Indication: pulmonary embolus  No Known Allergies  Patient Measurements: Height: 5\' 6"  (167.6 cm) Weight: 47.2 kg (104 lb 0.9 oz) IBW/kg (Calculated) : 59.3 Heparin Dosing Weight: 47.2kg  Vital Signs: BP: 200/93 (10/29 1400) Pulse Rate: 76 (10/29 1400)  Labs: Recent Labs    09/15/20 0203 09/15/20 0426 09/15/20 0626  HGB 8.9*  --   --   HCT 31.7*  --   --   PLT 151  --   --   CREATININE 2.58*  --   --   TROPONINIHS  --  55* 49*    Estimated Creatinine Clearance: 12.1 mL/min (A) (by C-G formula based on SCr of 2.58 mg/dL (H)).  Assessment: 86 year old female presenting with right breast swelling with no respiratory distress. An ECHO was performed today and possible PE was found in R pulmonary artery and recommendation for V/Q or CTA was made. Pt has no symptoms of dyspnea, tachycardia, no oxygen has been administered, and there was no right heart strain visualized. Pt did receive 5000u of subcu heparin at 1333 today (10/29). Pt is not on any anticoagulants prior to admission. Pt also has significant renal dysfunction with current Scr 2.58 and current weight 47.2kg. Given current presentation, age, weight, renal function, and recent administration of subcu heparin today, will not bolus this patient with heparin.   CBC Hgb 8.9; Plt 151 Troponin 55>49  Goal of Therapy:  Heparin level 0.3-0.7 units/ml Monitor platelets by anticoagulation protocol: Yes   Plan:  No heparin bolus Heparin infusion 800 units/hr 8h HL at 2300  Monitor s/sx bleeding, HL, CBC, clinical progress, and imaging [place]  PGY1 Pharmacy Resident 09/15/2020,2:15 PM

## 2020-09-15 NOTE — H&P (Addendum)
Date: 09/15/2020               Patient Name:  Tiffany Bridges MRN: [id number]  DOB: 01-Jan-1934 Age / Sex: 86 y.o., female   PCP: Willene Hatchet, NP         Medical Service: Internal Medicine Teaching Service         Attending Physician: Dr. Johnnette Gourd DO.    First Contact: Dr. Shon Baton Pager: [phone]  Second Contact: Dr. Laural Golden Pager: [phone]       After Hours (After 5p/  First Contact Pager: [phone]  weekends / holidays): Second Contact Pager: [phone]   Chief Complaint: Rt side facial and rt breast swelling  History of Present Illness: 86 year old female with past medical history of HFpEF, CKD 4, IDA, DM 2, presented from [place] with facial swelling. She ate supper around 6:30 pm last night without a problem, When staff went to check on her and help her to go to bed, she noted to have some facial swelling at rt side of her face as well as rt breast swelling. She did not complain of pain and was not in distress. She does not have any complaints and she denies any pain or any complaint now and since then.  I attempted to contact [facility] for more detail but it was not successful. Per her sister, pt was at her normal base when she saw her last time on Sunday. I talked [facility] and talked. PCP states that the only complaint was she wanted to go home but no other complaint or any report from SNF to her PCP. Per PCP, pt typically startred on Depakote and citalopram increased last week.   She follows with nephrology. Per her no significant issue yesterday before this episode. Per PCP, there is an echo from [facility] on 04/2020 that EF 60-65%.  ED course: Per ED provider note, patient remained asymptomatic on arrival.   Her vitals were stable.  However she had mild fascial swelling (periorbital edema and swelling of right cheek) on exam as well as rt breast swelling and bilateral feet swelling. Significant lab results: Sodium 147, alkaline phosphatase 207, WBC  2.5, hemoglobin 8.9, BNP more than 4500, high-sensitivity troponin 55.  CT chest without contrast showed anasarca, volume overload, right more than left pleural effusion.  Also pericardial effusion (nearly 2 cm along the inferior heart), asymmetric severe right side chest wall swelling persistent left SVC.    Home Meds per PCP: Lasix 20 mg qd, metoprolol tartrate 25 mg BID, Depakote (for behavior issue), Protonix 40 mg twice daily, Citalopram, Glipizode 2.5 mg, protonix, SennaCot, Trajenta, DuoNeb BID, GVit D.  Current Meds  Medication Sig  . acetaminophen (TYLENOL) 325 MG tablet Take 650 mg by mouth every 6 (six) hours as needed for mild pain.  Marland Kitchen epoetin alfa-epbx (RETACRIT) 43888 UNIT/ML injection Inject 10,000 Units into the skin every 14 (fourteen) days.  . ferrous sulfate 325 (65 FE) MG tablet Take 325 mg by mouth daily with breakfast.  . furosemide (LASIX) 20 MG tablet Take 1 tablet (20 mg total) by mouth daily as needed for edema (weight gain >3lbs, shortness of breath, lower extremity swelling).  Marland Kitchen glipiZIDE (GLUCOTROL) 5 MG tablet Take 2.5 mg by mouth daily before breakfast.  . Glucerna (GLUCERNA) LIQD Take 1 Can by mouth in the morning and at bedtime.  Marland Kitchen ipratropium-albuterol (DUONEB) 0.5-2.5 (3) MG/3ML SOLN Take 3 mLs by nebulization 4 (four) times daily as  needed (For wheezing, Shortness of breath).  . linagliptin (TRADJENTA) 5 MG TABS tablet Take 5 mg by mouth daily.  . Maltodextrin-Xanthan Gum (RESOURCE THICKENUP CLEAR) POWD Take 1 Can by mouth See admin instructions. Measure recommended amount of thickener. Add to beverage of choice to make nectar like consistency for all liquids.  . metoprolol succinate (TOPROL-XL) 25 MG 24 hr tablet Take 25 mg by mouth daily.  . Multiple Vitamins-Minerals (CENTRUM ADULTS PO) Take 1 tablet by mouth daily.  Marland Kitchen oxybutynin (DITROPAN) 5 MG tablet Take 5 mg by mouth at bedtime.  . pantoprazole (PROTONIX) 40 MG tablet Take 40 mg by mouth 2 (two) times  daily.  . sennosides-docusate sodium (SENOKOT-S) 8.6-50 MG tablet Take 1 tablet by mouth daily.     Allergies: Allergies as of 09/15/2020  . (No Known Allergies)   Past Medical History:  Diagnosis Date  . (HFpEF) heart failure with preserved ejection fraction ([place])   . Anemia, iron deficiency   . CKD (chronic kidney disease) stage 3, GFR 30-59 ml/min   . Diabetes ([place])     Family History:  No family history on file. Social History:  Social History   Tobacco Use  . Smoking status: Never Smoker  . Smokeless tobacco: Never Used  Vaping Use  . Vaping Use: Never used  Substance Use Topics  . Alcohol use: Not on file  . Drug use: Not Currently   Review of Systems: A complete ROS was negative except as per HPI.   Physical Exam: Blood pressure (!) 153/95, pulse 73, temperature 98 F (36.7 C), temperature source Rectal, resp. rate 19, weight 47.2 kg, SpO2 95 %.  Physical Exam Constitutional:      General: She is not in acute distress.    Appearance: She is not toxic-appearing or diaphoretic.  Cardiovascular:     Rate and Rhythm: Normal rate and regular rhythm.     Heart sounds: Normal heart sounds. No murmur heard.      Comments: No JVD and no hepatojugular reflux Pulmonary:     Effort: Pulmonary effort is normal. No respiratory distress.     Breath sounds: Rhonchi and rales present.  Chest:     Chest wall: No tenderness.  Abdominal:     General: There is no distension.     Palpations: Abdomen is soft.     Tenderness: There is no abdominal tenderness. There is no guarding.  Musculoskeletal:        General: Swelling present.     Cervical back: No tenderness.     Right lower leg: Edema present.     Left lower leg: Edema present.  Skin:    General: Skin is warm and dry.     Findings: No bruising, erythema or rash.  Neurological:     General: No focal deficit present.     Mental Status: Mental status is at baseline.     Motor: No weakness.        EKG:  personally reviewed my interpretation is decreased amplitude, poor R progression, no acute ST-T changes  CXR: personally reviewed my interpretation is swelling of soft tissue at right chest wall, cardiomegaly, infiltration and opacity at right lower lung.  Right-sided pleural effusion or mass that seems to involve the right diaphragm?  CXR: Cardiomegaly with mild right basilar atelectasis and/or infiltrate. 2. Focal opacity overlying the lateral aspect of the right lung base which may represent a small amount of focal infiltrate. The presence of a loculated right-sided pleural effusion or  pleural mass cannot be excluded.   CT chest w/o contrast: Volume overload: Right more than left pleural effusion, moderate on the right. *Pericardial effusion measuring nearly 2 cm in thickness along the inferior heart. Anasarca with asymmetric severe right-sided chest wall swelling. There is a persistent left SVC, but no right brachiocephalic vein compression. No visible axillary mass  Assessment & Plan by Problem: Active Problems:   Hypernatremia   Anasarca   CKD (chronic kidney disease) stage 4, GFR 15-29 ml/min ([place])  86 year old female with past medical history of HFpEF, CKD 4, IDA, DM 2, presented from [place] with facial and rt breast swelling few hours after she had dinner. She does not have any complaints and no respiratory distress. VS stable. Sodium 147, alkaline phosphatase 207, WBC 2.5, hemoglobin 8.9, BNP more than 4500, high-sensitivity troponin 55.CT chest without contrast showed anasarca, volume overload, right more than left pleural effusion.  Also pericardial effusion (nearly 2 cm along the inferior heart), asymmetric severe right side chest wall swelling persistent left SVC.   Anasarca: Mild right side facial swelling Rt/lat chest wall edema, rt back swelling on exam w/o percutaneous emphysema. Pleural effusion R>L Pericardial effusion Persistent left SVC.  Asymptomatic and vitals are  stable. Patient received 40 mg of IV Lasix in ED.  Troponin 55>49. Elevated BNP at> 4500. Alb is low at 2.3. Patient has facial swelling, chest edema and anasarca, pleural and pericardial effusion reported on CT scan.   Hypervolemia and (dependent) edema likely in setting of HFpEF and malnutrition.   Other broad differentials include: HFpEF exacerbation, nephrotic syndrome, cirrhosis, hypothyroidism myxedema, malignancy, malnutrition, (or refeeding edema vs allergic reaction to food as her symptoms noted by staff after she ate (can probably explain the facial and chest wall swelling but not pleural and pericardial effusion and elevated BNP). It does not look like to be SVC syndrome but other venous obstruction can be on differential). Her Na is 147, and calculated serum OSM is 315. it looks like she has extravascular fluid collection and third spacing. This can be contributing to malnutrition and low Alb.   She has opacity at her right lower lung. However, CT chest did not report any mass.  Pericardial effusion: No evidence of tamponade per evaluation on ED and also on admission.  Vitals are stable, no JVD.   -F/u Echo -RUQ ultrasound -S/p 40 mg IV lasix today. Giving another 40 mg IV lasix 1:30 PM. -Strict I and Os -Weight daily -CMP daily -TSH -UA, (for serum Pr), urine sodium -No evidence of tamponade now but will monitor for any decompensation. -cardiac monitoring -Fluid restriction -Nutrition consult -Swallow eval  Very mild hypernatremia: Her Na is 147. It looks like she has more extravascular edema and third spacing. This can be due to malnutrition. No weight in chart today to  No weight in chart for today to calculate total water deficit. She is generally volume overload but likely most extra vascularly. S/p 40 mg IV lasix.   -Urine Na -metabolic panel tomorrow morning  Leukopenia: WBC 2.5 which is new compairing to last CBC, 4 months ago. No evidence of sepsis.  Her  PLT is nl but lower than before. Her Hb is 8.9 but this is not very far from her baseline.  If remains consistent, etiologies such as bone marrow suppression such is malignant causes, infiltration, aplastic anemia,..  CBC Latest Ref Rng & Units 09/15/2020 05/16/2020 05/15/2020  WBC 4.0 - 10.5 K/uL 2.5(L) 9.0 10.8(H)  Hemoglobin 12.0 - 15.0  g/dL 8.9(L) 9.8(L) 9.5(L)  Hematocrit 36 - 46 % 31.7(L) 33.6(L) 32.7(L)  Platelets 150 - 400 K/uL 151 346 302  -CBC daily  HTN: Pt is hypertensive this admission. She has received Lasix.  -May add Metoprolol if echo comes back unremarkable and no concern for acute HF ADDENDUM: Paged by RN about BP 190/99 will monitor. Paged again about her BP is increasing to 200/110. Giving 5 ,g IV Hydralazine. -resume metoprolol if no concern on echo   DM2:  On Tradjenta (linagliptin) 5 mg po daily, glipizide 2.5 mg daily at home  -SSI>ADDENDUM. DC SSI due to low BG 47. Improved to 155 after 1/2 glucose ampule. CBG (last 3)  Recent Labs    09/15/20 1230 09/15/20 1250  GLUCAP 47* 155*    -CBG monitoring -Hemoglobin A1c  GOC: Pt has dysphagia and intellectual disability at baseline:  Patient follows some of my commands today. She does not appear to be oriented and when I ask her can you tell me your name, she says "No". SNF staff did not mention change from her baseline. Per chart review, patient became DNR on last hospitalization.   -Will talk to family again today regarding goal of care> I talked to patient's sister [name]. sister wants [place] to do as much as we can do. But keep her DNR.  Per sister, at baseline she sometimes is cooperative and sometimes not and sometimes she is stubborn but otherwise she knows her sister and recognizes the staff and friend (per sister). She knows that she is not at home and she is SNF. But not sure if she is oriented to time.  I specifically ask about aggressive tests for example for suspicious mass. She mentions she wants  every  thing that is needed to keep her live except resuscitation.  Sister saw her last Sunday in [place], and she was at her baseline and kept saying she wanna go home.   -Full scope of care but DNR/DNI per her sister.  Suspicious finding on prior bladder ultrasound: Bladder Ultrasound on June 2021 showed bladder mass versus debris's in bladder on bladder ultrasound.   Renal US 04/2020 showed: Question bladder mass 2.2 cm diameter recommend correlation with a Cystoscopy. Cyst identified within both kidneys, including a septated cyst at the peripelvic region of the RIGHT kidney 12 mm greatest size. Apparently no further work up performed then (per family?  But her sister would like full care and diagnostic approach today)   -May consider renal and bladder ultrasound this admission  Diet: Pending swallow eval IV fluid: None VTE ppx: Subcu heparin Code status: DNR DNI  Dispo: Admit patient to Inpatient with expected length of stay greater than 2 midnights.  SignedDewayne Hatch, MD 09/15/2020, 1:49 PM  Pager: [phone] After 5pm on weekdays and 1pm on weekends: On Call pager: [phone]

## 2020-09-15 NOTE — ED Notes (Signed)
Admitted team internal medical made aware of pt being hypertensive. No new orders received at this time

## 2020-09-15 NOTE — ED Notes (Addendum)
bs 155

## 2020-09-16 ENCOUNTER — Inpatient Hospital Stay (HOSPITAL_COMMUNITY): Payer: Medicare (Managed Care)

## 2020-09-16 DIAGNOSIS — I2699 Other pulmonary embolism without acute cor pulmonale: Secondary | ICD-10-CM

## 2020-09-16 DIAGNOSIS — F419 Anxiety disorder, unspecified: Secondary | ICD-10-CM | POA: Diagnosis present

## 2020-09-16 DIAGNOSIS — Z7189 Other specified counseling: Secondary | ICD-10-CM

## 2020-09-16 LAB — COMPREHENSIVE METABOLIC PANEL
ALT: 18 U/L (ref 0–44)
AST: 21 U/L (ref 15–41)
Albumin: 2.3 g/dL — ABNORMAL LOW (ref 3.5–5.0)
Alkaline Phosphatase: 216 U/L — ABNORMAL HIGH (ref 38–126)
Anion gap: 10 (ref 5–15)
BUN: 47 mg/dL — ABNORMAL HIGH (ref 8–23)
CO2: 26 mmol/L (ref 22–32)
Calcium: 8.4 mg/dL — ABNORMAL LOW (ref 8.9–10.3)
Chloride: 109 mmol/L (ref 98–111)
Creatinine, Ser: 2.45 mg/dL — ABNORMAL HIGH (ref 0.44–1.00)
GFR, Estimated: 19 mL/min — ABNORMAL LOW (ref >60)
Glucose, Bld: 108 mg/dL — ABNORMAL HIGH (ref 70–99)
Potassium: 4.1 mmol/L (ref 3.5–5.1)
Sodium: 145 mmol/L (ref 135–145)
Total Bilirubin: 0.8 mg/dL (ref 0.3–1.2)
Total Protein: 5.9 g/dL — ABNORMAL LOW (ref 6.5–8.1)

## 2020-09-16 LAB — TSH: TSH: 5.692 u[IU]/mL — ABNORMAL HIGH (ref 0.350–4.500)

## 2020-09-16 LAB — GLUCOSE, CAPILLARY
Glucose-Capillary: 108 mg/dL — ABNORMAL HIGH (ref 70–99)
Glucose-Capillary: 116 mg/dL — ABNORMAL HIGH (ref 70–99)
Glucose-Capillary: 155 mg/dL — ABNORMAL HIGH (ref 70–99)
Glucose-Capillary: 159 mg/dL — ABNORMAL HIGH (ref 70–99)
Glucose-Capillary: 159 mg/dL — ABNORMAL HIGH (ref 70–99)
Glucose-Capillary: 76 mg/dL (ref 70–99)

## 2020-09-16 LAB — HEPARIN LEVEL (UNFRACTIONATED)
Heparin Unfractionated: 0.94 IU/mL — ABNORMAL HIGH (ref 0.30–0.70)
Heparin Unfractionated: 0.84 IU/mL — ABNORMAL HIGH (ref 0.30–0.70)
Heparin Unfractionated: 0.57 IU/mL (ref 0.30–0.70)

## 2020-09-16 LAB — PROTEIN / CREATININE RATIO, URINE
Creatinine, Urine: 13.94 mg/dL
Protein Creatinine Ratio: 9.54 mg/mg{Cre} — ABNORMAL HIGH (ref 0.00–0.15)
Total Protein, Urine: 133 mg/dL

## 2020-09-16 LAB — CBC
HCT: 32.8 % — ABNORMAL LOW (ref 36.0–46.0)
Hemoglobin: 9.5 g/dL — ABNORMAL LOW (ref 12.0–15.0)
MCH: 23.1 pg — ABNORMAL LOW (ref 26.0–34.0)
MCHC: 29 g/dL — ABNORMAL LOW (ref 30.0–36.0)
MCV: 79.8 fL — ABNORMAL LOW (ref 80.0–100.0)
Platelets: 163 10*3/uL (ref 150–400)
RBC: 4.11 MIL/uL (ref 3.87–5.11)
RDW: 16.6 % — ABNORMAL HIGH (ref 11.5–15.5)
WBC: 2.3 10*3/uL — ABNORMAL LOW (ref 4.0–10.5)
nRBC: 0 % (ref 0.0–0.2)

## 2020-09-16 LAB — PROTIME-INR
INR: 1.2 (ref 0.8–1.2)
Prothrombin Time: 14.8 seconds (ref 11.4–15.2)

## 2020-09-16 MED ORDER — HYDRALAZINE HCL 20 MG/ML IJ SOLN
5.0000 mg | Freq: Once | INTRAMUSCULAR | Status: AC
  Administered 2020-09-16: 5 mg via INTRAVENOUS
  Filled 2020-09-16: qty 1

## 2020-09-16 MED ORDER — RESOURCE THICKENUP CLEAR PO POWD
ORAL | Status: DC | PRN
  Filled 2020-09-16: qty 125

## 2020-09-16 MED ORDER — FUROSEMIDE 10 MG/ML IJ SOLN
40.0000 mg | Freq: Once | INTRAMUSCULAR | Status: AC
  Administered 2020-09-16: 40 mg via INTRAVENOUS
  Filled 2020-09-16: qty 4

## 2020-09-16 MED ORDER — NEPRO/CARBSTEADY PO LIQD
237.0000 mL | Freq: Two times a day (BID) | ORAL | Status: DC
  Administered 2020-09-16 – 2020-09-18 (×3): 237 mL via ORAL

## 2020-09-16 MED ORDER — DEXTROSE 50 % IV SOLN
25.0000 mL | Freq: Once | INTRAVENOUS | Status: AC
  Administered 2020-09-16: 25 mL via INTRAVENOUS
  Filled 2020-09-16: qty 50

## 2020-09-16 MED ORDER — HYDRALAZINE HCL 20 MG/ML IJ SOLN: 5.0000 mg | Freq: Once | INTRAMUSCULAR | Status: DC

## 2020-09-16 MED ORDER — ADULT MULTIVITAMIN W/MINERALS CH
1.0000 | ORAL_TABLET | Freq: Every day | ORAL | Status: DC
  Administered 2020-09-16 – 2020-09-18 (×3): 1 via ORAL
  Filled 2020-09-16 (×3): qty 1

## 2020-09-16 MED ORDER — HYDRALAZINE HCL 10 MG PO TABS
10.0000 mg | ORAL_TABLET | Freq: Once | ORAL | Status: AC
  Administered 2020-09-16: 10 mg via ORAL
  Filled 2020-09-16: qty 1

## 2020-09-16 NOTE — Progress Notes (Signed)
HD#1 Subjective:   Admitted yesterday. Started on heparin per pharmacy for suspected pulmonary embolism. Overnight found to be supratherapeutic with initial dosing, no issues with drip or signs of bleeding per RN. Rate adjusted by pharmacy accordingly.   Patient also seen by the palliative medicine team yesterday who made unsuccessful attempt to reach patient's older sister and HCPOA, [name]. Patient confirmed DNI/DNR with PACE team. Palliative to attempt contact with patient's family today.  During rounds this morning, patient lying in bed, sleeping but arouses to voice, follows commands to open eyes but otherwise does not answer/participate in exam.   Objective:   Vital signs in last 24 hours: Vitals:   09/15/20 1836 09/15/20 2300 09/16/20 0043 09/16/20 0347  BP: (!) 185/69 (!) 188/71 (!) 170/76 (!) 190/74  Pulse: 77 80 74 80  Resp:   20 16  Temp:  97.8 F (36.6 C) 97.8 F (36.6 C) 97.7 F (36.5 C)  TempSrc:  Oral Oral Oral  SpO2: 94% 91% 97% 98%  Weight:    45.1 kg  Height:       Supplemental O2: Nasal Cannula 2L SpO2: 98 %  Physical Exam Constitutional: critically ill-appearing and very thin woman lying on her left side in bed, sleeping at first but arouses to voice, in no acute distress HENT: normocephalic atraumatic, patient will not open her mouth to command; small amount of dried blood on lower lip, dime-sized dried drop of blood on pillow Cardiovascular: regular rate and rhythm, no m/r/g, 2+ pitting edema in the R leg, 1+ pitting edema in the L leg Pulmonary/Chest: normal work of breathing on 2L [place], lung sounds difficult to appreciate secondary to patient participation in exam Skin: soft tissue swelling vs edema along the L side of the spine  Filed Weights   09/15/20 1152 09/15/20 1400 09/16/20 0347  Weight: 47.2 kg 47.2 kg 45.1 kg    Intake/Output Summary (Last 24 hours) at 09/16/2020 0608 Last data filed at 09/16/2020 0044 Gross per 24 hour  Intake --   Output 1400 ml  Net -1400 ml   Net IO Since Admission: -1,400 mL [09/16/20 0608]  Pertinent Labs: CBC Latest Ref Rng & Units 09/16/2020 09/15/2020 05/16/2020  WBC 4.0 - 10.5 K/uL 2.3(L) 2.5(L) 9.0  Hemoglobin 12.0 - 15.0 g/dL 9.5(L) 8.9(L) 9.8(L)  Hematocrit 36 - 46 % 32.8(L) 31.7(L) 33.6(L)  Platelets 150 - 400 K/uL 163 151 346    CMP Latest Ref Rng & Units 09/16/2020 09/15/2020 05/16/2020  Glucose 70 - 99 mg/dL 108(H) 84 233(H)  BUN 8 - 23 mg/dL 47(H) 46(H) 38(H)  Creatinine 0.44 - 1.00 mg/dL 2.45(H) 2.58(H) 2.52(H)  Sodium 135 - 145 mmol/L 145 147(H) 146(H)  Potassium 3.5 - 5.1 mmol/L 4.1 4.4 3.4(L)  Chloride 98 - 111 mmol/L 109 111 113(H)  CO2 22 - 32 mmol/L 26 25 23   Calcium 8.9 - 10.3 mg/dL 8.4(L) 8.3(L) 8.0(L)  Total Protein 6.5 - 8.1 g/dL 5.9(L) 5.5(L) -  Total Bilirubin 0.3 - 1.2 mg/dL 0.8 0.4 -  Alkaline Phos 38 - 126 U/L 216(H) 207(H) -  AST 15 - 41 U/L 21 21 -  ALT 0 - 44 U/L 18 20 -    Imaging:  09/16/20 Lower Venous DVT Study   Indications: Anasarca.    Limitations: Altered mental status, patient in fetal position and non  cooperative.  Comparison Study: No prior study   Performing Technologist: Sharion Dove RVS   Summary:  RIGHT:  - There is no evidence  of deep vein thrombosis in the lower extremity.  However, portions of this examination were limited- see technologist  comments above.    pulsatile waveforms noted, interstitial edema noted throughout    LEFT:  - There is no evidence of deep vein thrombosis in the lower extremity.  However, portions of this examination were limited- see technologist  comments above.    Pulsatile waveforms noted, interstitial fluid noted throughout.    *See table(s) above for measurements and observations.   Electronically signed by Deitra Mayo MD on 09/16/2020 at 1:50:42  PM.    ECHOCARDIOGRAM COMPLETE  Result Date: 09/15/2020    ECHOCARDIOGRAM REPORT   Patient Name:   Tiffany Bridges Date  of Exam: 09/15/2020 Medical Rec #:  [id number]      Height:       66.0 in Accession #:    [id number]     Weight:       96.8 lb Date of Birth:  12/31/1933      BSA:          1.469 m Patient Age:    45 years       BP:           197/124 mmHg Patient Gender: F              HR:           74 bpm. Exam Location:  Inpatient Procedure: 2D Echo, Cardiac Doppler and Color Doppler  Reported to: Dr Tish Men on 09/15/2020 1:27:00 PM. Indications:    Dyspnea 786.09 / /R06.00  History:        Patient has no prior history of Echocardiogram examinations.                 CHF; Risk Factors:Diabetes. CKD.  Sonographer:    Jonelle Sidle Dance Referring Phys: [id number] [place]  1. The right pulmonary artery has what appears to be thrombus in it. It is rather large clot burden. CT PE study or V/Q recommended.  2. The coronary sinus is dilated concerning for persistent L SVC. This was seen on CT chest.  3. Left ventricular ejection fraction, by estimation, is 50 to 55%. The left ventricle has low normal function. The left ventricle has no regional wall motion abnormalities. There is moderate concentric left ventricular hypertrophy. Left ventricular diastolic function could not be evaluated. There is the interventricular septum is flattened in systole and diastole, consistent with right ventricular pressure and volume overload.  4. Right ventricular systolic function is mildly reduced. The right ventricular size is moderately enlarged. There is severely elevated pulmonary artery systolic pressure. The estimated right ventricular systolic pressure is 66.5 mmHg.  5. Left atrial size was mildly dilated.  6. Moderate pericardial effusion. The pericardial effusion is posterior to the left ventricle and lateral to the left ventricle. There is no evidence of cardiac tamponade.  7. The mitral valve is degenerative. Moderate mitral valve regurgitation. No evidence of mitral stenosis. Moderate to severe mitral annular calcification.  8. The  aortic valve is tricuspid. Aortic valve regurgitation is not visualized. No aortic stenosis is present.  9. The inferior vena cava is normal in size with <50% respiratory variability, suggesting right atrial pressure of 8 mmHg. Comparison(s): No prior Echocardiogram. Conclusion(s)/Recommendation(s): Findings consistent with Cor Pulmonale. FINDINGS  Left Ventricle: Left ventricular ejection fraction, by estimation, is 50 to 55%. The left ventricle has low normal function. The left ventricle has no regional wall motion abnormalities. The left ventricular internal cavity size  was normal in size. There is moderate concentric left ventricular hypertrophy. The interventricular septum is flattened in systole and diastole, consistent with right ventricular pressure and volume overload. Left ventricular diastolic function could not be evaluated due to  mitral annular calcification (moderate or greater). Left ventricular diastolic function could not be evaluated. Right Ventricle: The right ventricular size is moderately enlarged. No increase in right ventricular wall thickness. Right ventricular systolic function is mildly reduced. There is severely elevated pulmonary artery systolic pressure. The tricuspid regurgitant velocity is 3.61 m/s, and with an assumed right atrial pressure of 8 mmHg, the estimated right ventricular systolic pressure is 90.2 mmHg. Left Atrium: Left atrial size was mildly dilated. Right Atrium: Right atrial size was normal in size. Pericardium: A moderately sized pericardial effusion is present. The pericardial effusion is posterior to the left ventricle and lateral to the left ventricle. The pericardial effusion appears to contain fibrous material. There is no evidence of cardiac tamponade. Mitral Valve: The mitral valve is degenerative in appearance. There is moderate calcification of the anterior mitral valve leaflet(s). Moderate to severe mitral annular calcification. Moderate mitral valve  regurgitation. No evidence of mitral valve stenosis. Tricuspid Valve: The tricuspid valve is grossly normal. Tricuspid valve regurgitation is mild . No evidence of tricuspid stenosis. Aortic Valve: The aortic valve is tricuspid. Aortic valve regurgitation is not visualized. No aortic stenosis is present. Pulmonic Valve: The pulmonic valve was grossly normal. Pulmonic valve regurgitation is mild. No evidence of pulmonic stenosis. Aorta: The aortic root and ascending aorta are structurally normal, with no evidence of dilitation. Venous: The right upper pulmonary vein is normal. The inferior vena cava is normal in size with less than 50% respiratory variability, suggesting right atrial pressure of 8 mmHg. IAS/Shunts: There is left bowing of the interatrial septum, suggestive of elevated right atrial pressure. The atrial septum is grossly normal.  LEFT VENTRICLE PLAX 2D LVIDd:         4.20 cm  Diastology LVIDs:         2.80 cm  LV e' medial:    2.50 cm/s LV PW:         1.40 cm  LV E/e' medial:  47.6 LV IVS:        1.20 cm  LV e' lateral:   3.70 cm/s LVOT diam:     1.80 cm  LV E/e' lateral: 32.2 LV SV:         56 LV SV Index:   38 LVOT Area:     2.54 cm  RIGHT VENTRICLE            IVC RV Basal diam:  2.60 cm    IVC diam: 1.80 cm RV S prime:     5.98 cm/s TAPSE (M-mode): 1.4 cm LEFT ATRIUM             Index       RIGHT ATRIUM           Index LA diam:        3.70 cm 2.52 cm/m  RA Area:     12.90 cm LA Vol (A2C):   61.7 ml 42.00 ml/m RA Volume:   30.60 ml  20.83 ml/m LA Vol (A4C):   38.4 ml 26.14 ml/m LA Biplane Vol: 51.8 ml 35.26 ml/m  AORTIC VALVE LVOT Vmax:   112.00 cm/s LVOT Vmean:  64.000 cm/s LVOT VTI:    0.222 m  AORTA Ao Root diam: 2.70 cm Ao Asc diam:  2.60 cm MITRAL  VALVE                TRICUSPID VALVE MV Area (PHT): 4.68 cm     TR Peak grad:   52.1 mmHg MV Decel Time: 162 msec     TR Vmax:        361.00 cm/s MV E velocity: 119.00 cm/s MV A velocity: 135.00 cm/s  SHUNTS MV E/A ratio:  0.88         Systemic  VTI:  0.22 m                             Systemic Diam: 1.80 cm Eleonore Chiquito MD Electronically signed by Eleonore Chiquito MD Signature Date/Time: 09/15/2020/1:57:42 PM    Final    US Abdomen Limited RUQ (LIVER/GB)  Result Date: 09/15/2020 CLINICAL DATA:  Anasarca EXAM: ULTRASOUND ABDOMEN LIMITED RIGHT UPPER QUADRANT COMPARISON:  CT abdomen pelvis 05/03/2020 FINDINGS: Gallbladder: Prior CT demonstrated layering dense material in the gallbladder suggestive of small stones. Ultrasound is not confirmed gallstones. No gallbladder wall thickening. Negative sonographic Murphy sign Common bile duct: Diameter: 6.9 mm, upper normal. Liver: No focal lesion identified. Within normal limits in parenchymal echogenicity. Portal vein is patent on color Doppler imaging with normal direction of blood flow towards the liver. Other: No ascites.  Right pleural effusion. Mild right renal hydronephrosis with echogenic right renal cortex. IMPRESSION: Prior CT suggested small layering gallstones however these are not seen on the current ultrasound. Gallstones may have passed in the interval. Common bile duct 6.9 mm, upper normal Right pleural effusion.  Mild right hydronephrosis. Electronically Signed   By: Franchot Gallo M.D.   On: 09/15/2020 10:41    Assessment/Plan:   Active Problems:   Hypernatremia   Anasarca   CKD (chronic kidney disease) stage 4, GFR 15-29 ml/min (HCC)   BMI less than 19,adult   DNR (do not resuscitate)   DNI (do not intubate)   Weakness   Patient Summary: Anglea Gordner is an 86 year old woman and PACE participant with a significant past medical history of heart failure with preserved ejection fraction, CKD stage IV, iron deficiency anemia and diabetes type 2 who presented from SNF for right sided facial swelling and found to have cor pulmonale suspected due to pulmonary embolism.  This is hospital day 1.  Anasarca due to cor pulmonale suspected due to pulmonary embolism Stat echocardiogram  yesterday revealed cor pulmonale due to suspected pulmonary artery embolism. Given her CKD stage IV, CT angiogram was not pursued, and VQ scan was considered but not pursued given already high pretest probability of pulmonary embolism. Heparin infusion started yesterday. Patient found to be supratherapeutic with initial dosing and rate adjusted by pharmacy accordingly. This morning on exam, patient noted to have dried blood on lip and sheets, and during evaluation by SLP noted to have wound on her tongue tip. No ongoing bleeding. Noted to have bilateral lower extremity edema R>L as well as persistent edema on back, so will dose with additional 40 mg IV Lasix. As below, patient also noted to be hypertensive. Bilateral lower extremity dopplers negative for DVT.  - 40 mg IV Lasix - Heparin per pharmacy - Palliative care consulted, appreciate their expertise (see below)  HTN Patient on metoprolol 25 mg 24 hr tablet at home which is being held secondary to cor pulmonale. BPs have been persistently elevated this admission. Received 5 mg PRN hydralazine yesterday for BP 200/110. This morning BP 170s-190s/60-80s. Difficult  to assess whether patient symptomatic due to limited participation in exam. Diuresing further as above. Given additional hydral IV 5 mg with good effect, however will transition to PO since patient has passed SLP evaluation. - s/p hydralazine 5 mg IV this morning - hydralazine 10 mg PO this afternoon - Reassess and titrate accordingly  Leukopenia WBC stable at 2.3, 2.5 yesterday. ANC 1.0, consistent with moderate neutropenia. Hemoglobin improved/stable, 9.5 this morning, from 8.9 yesterday. Platelets 163, up from 151 yesterday. Will continue to monitor, but suspect secondary to chronic disease/malnourishment. - Continue to monitor  Goals of Care  Palliation Per palliative, patient's sister/[name] would like to continue full scope medical treatment with watchful waiting. States if  interventions are not working and/or the patient does not seem to be improving, would be open to hospice in the future. States she is ok with the patient discharging back to [facility] if appropriate depending on hospital course. Requests update from the medical team. - Will call [name] to update - Ativan 1 mg IV q4h PRN for anxiety/agitation  Mild hypernatremia, resolved Sodium 146 on admission, improved to 145 today.  - Continue to monitor  CKD Stage IV Creatinine improved slightly to 2.45 today from 2.85 on admission. Urinalysis on admission with 100 protein. Will obtain UPC. Possible nephrotic syndrome could have contributed to hypercoagulability. - UPC  Underweight with BMI of 16.8 Nutrition following, appreciate recs: - Nepro Shake po BID, each supplement provides 425 kcal and 19 grams protein - Magic cup TID with meals, each supplement provides 290 kcal and 9 grams of protein - MVI with minerals daily - Feeding assistance with meals  Suspicious finding on prior bladder ultrasound: Bladder Ultrasound on June 2021 showed bladder mass versus debris in bladder on bladder ultrasound. Renal US 04/2020 showed questionable bladder mass 2.2 cm diameter recommend correlation with a Cystoscopy. Cyst identified within both kidneys, including a septated cyst at the peripelvic region of the RIGHT kidney 12 mm greatest size. Apparently no further work up performed then. - May consider renal and bladder ultrasound this admission depending on ongoing [place] conversations  Type 2 diabetes mellitus On Tradjenta (linagliptin) 5 mg po daily, glipizide 2.5 mg daily at home. Placed on SSI on admission which was discontinued after low BG at 47 which improved after 1/2 glucose ampule. Overnight, patient required additional ampule D50 after BG 57. CBG frequency increased to q4h. - q4h CBG monitoring - Hemoglobin A1c 7.3  Diet: Dysphagia 1 with honey-thick liquids IVF: None,None VTE: Heparin Code:  DNR   Dispo: Anticipated discharge back to [facility] in pending clinical improvement.   Please contact the on call pager after 5 pm and on weekends at [phone].  Alexandria Lodge, MD PGY-1 Internal Medicine Teaching Service Pager: [phone] 09/16/2020

## 2020-09-16 NOTE — Progress Notes (Signed)
09/16/20 0700  SLP Visit Information  SLP Received On 09/16/20  General Information  HPI 86 y.o. female  with past medical history of intellectual disability, diabetes mellitus type 2, CKD, CHF, iron deficiency anemia, and dysphagia presented to the ED on 09/15/20 from [place] for right sided facial and breast swelling. CT chest without contrast showed anasarca, volume overload, right more than left pleural effusion.  Also pericardial effusion (nearly 2 cm along the inferior heart), asymmetric severe right side chest wall swelling persistent left SVC. She is being admitted for anasarca due to cor pulmonale suspected due to pulmonary embolism and work up for suspicious finding on prior bladder ultrasound. History of dyspahgia includes MBS at [facility] 2019 documented aspiration of thin during MBS in 2018 and rec'd nectar and pt reported not using thickener. MBS 01/06/18 showed mild pharyngeal residue without penetration or aspiration and thin recommended. Pt admitted 05/12/20 for aspiration pna,found to have multiple risk factors for aspiration pna including coughing with thin liquids, poor dental hygiene, decreased mentation and awareness of PO, inability to meet nutritional needs. SLP signed off due to pts inability to particiapte in therapy.   Type of Study Bedside Swallow Evaluation  Previous Swallow Assessment see HPI  Diet Prior to this Study NPO  Temperature Spikes Noted No  Respiratory Status Room air  History of Recent Intubation No  Behavior/Cognition Alert;Requires cueing;Distractible  Oral Care Completed by SLP Other (Comment)  Oral Cavity - Dentition Poor condition  Vision Functional for self-feeding  Self-Feeding Abilities Able to feed self;Needs assist  Patient Positioning Upright in bed  Baseline Vocal Quality Normal  Volitional Cough Cognitively unable to elicit  Volitional Swallow Unable to elicit  Oral Motor/Sensory Function  Overall Oral Motor/Sensory Function Generalized  oral weakness  Ice Chips  Ice chips NT  Thin Liquid  Thin Liquid WFL  Presentation Straw  Nectar Thick Liquid  Nectar Thick Liquid WFL  Presentation Straw;Cup;Self Fed  Honey Thick Liquid  Honey Thick Liquid NT  Puree  Puree WFL  Presentation Spoon  Solid  Solid NT  SLP Assessment  Clinical Impression Statement (ACUTE ONLY) Pt demonstrates a moderate oral dysphagia, possibly secondary to wound on her tongue tip. Pt had dried blood around her mouth and may have bit her tongue overnight. She was cautious in accepting spoons of puree, but carefully achieved oral transit with a lingual thrusting movement. Pt was very eager to drink lqiuids and self fed a cup of nectar thick liquids and straw sips of thin water and coke. No overt signs of aspiration observed with either texture. Sister reported she has been drinking nectar thick liquids at her facility and likes them. Given prior recommendation for nectar and puree and history of pna, recommend resuming puree and nectar given current acute illness. Will f/u for tolerance and potential for upgrade.   SLP Visit Diagnosis Dysphagia, oropharyngeal phase (R13.12)  Impact on safety and function Mild aspiration risk  Other Related Risk Factors Cognitive impairment;History of dysphagia  Swallow Evaluation Recommendations  SLP Diet Recommendations Dysphagia 1 (Puree);Nectar-thick liquid  Liquid Administration via Cup;Straw  Medication Administration Crushed with puree  Supervision Staff to assist with self feeding  Compensations Minimize environmental distractions  Postural Changes Seated upright at 90 degrees  Treatment Plan  Oral Care Recommendations Oral care BID  Other Recommendations Have oral suction available  Treatment Recommendations Therapy as outlined in treatment plan below  Follow up Recommendations Skilled Nursing facility  Prognosis  Prognosis for [place]  Barriers to Reach Goals Severity of deficits;Cognitive  deficits  Individuals Consulted  Consulted and Agree with Results and Recommendations Patient;Family member/caregiver  Family Member Consulted sister  Progression Toward Goals  Progression toward goals Progressing toward goals  SLP Time Calculation  SLP Start Time (ACUTE ONLY) 0830  SLP Stop Time (ACUTE ONLY) 0850  SLP Time Calculation (min) (ACUTE ONLY) 20 min  SLP Evaluations  $ SLP Speech Visit 1 Visit  SLP Evaluations  $BSS Swallow 1 Procedure

## 2020-09-16 NOTE — Progress Notes (Signed)
ANTICOAGULATION CONSULT NOTE - Follow Up Consult  Pharmacy Consult for heparin Indication: suspected PE  Labs: Recent Labs    09/15/20 0203 09/15/20 0426 09/15/20 0626 09/16/20 0137  HGB 8.9*  --   --  9.5*  HCT 31.7*  --   --  32.8*  PLT 151  --   --  163  LABPROT  --   --   --  14.8  INR  --   --   --  1.2  HEPARINUNFRC  --   --   --  0.94*  CREATININE 2.58*  --   --  2.45*  TROPONINIHS  --  55* 49*  --     Assessment: 86yo female supratherapeutic on heparin with initial dosing for suspected PE; no gtt issues or signs of bleeding per RN.  Goal of Therapy:  Heparin level 0.3-0.7 units/ml   Plan:  Will decrease heparin gtt by 2 units/kg/hr to 700 units/hr and check level in 8 hours.    Wynona Neat, PharmD, BCPS  09/16/2020,3:32 AM

## 2020-09-16 NOTE — Hospital Course (Addendum)
LOS: 1  86 y.o. yo female  with HFpEF, IDA, Dm2 admitted on 09/15/2020 for acute swelling of the right face.  IMAGING 10/28 echo>> likely thrombus in right pulm artery. EF 50-55%. Interventricular flattening. Severely elevated PAP. Moderate pericardial effusion. Moderate MVR.   EVENTS 10/29 admission. Heparin started   ASSESSMENT/PLAN  Acute cor pulmonale 2/2 thrombus -currently on heparin.  --consider transitioning to [place]. Palliative care on board. Unable to contact family 10/29   FAMILY COMMUNICATION:  Barrier to D/C:

## 2020-09-16 NOTE — Progress Notes (Signed)
Daily Progress Note   Patient Name: Tiffany Bridges       Date: 09/16/2020 DOB: 1934/07/01  Age: 86 y.o. MRN#: [id number] Attending Physician: Lucious Groves, DO Primary Care Physician: Willene Hatchet, NP Admit Date: 09/15/2020  Reason for Consultation/Follow-up: Establishing goals of care  Subjective: Chart review performed. Received report from primary RN - no acute concerns. RN reports that when patient is not sleeping she is yelling out - encouraged to utilize PRN ativan - noted last/only dose that was given was 10/29 at 1803. Discussed utilizing tylenol for pain if needed - noted none has been given - encouraged RN that if this doesn't help to please reach out and let me know.  Went to visit patient at bedside - no family/visitors present. Patient was lying in bed asleep - I did not attempt to wake her. No signs or non-verbal gestures of pain or discomfort noted. No respiratory distress, increased work of breathing, or secretions noted.   Met with sister/[name] via phone  to discuss diagnosis, prognosis, GOC, EOL wishes, disposition, and options.  I introduced Palliative Medicine as specialized medical care for people living with serious illness. It focuses on providing relief from the symptoms and stress of a serious illness. The goal is to improve quality of life for both the patient and the family.  We discussed a brief life review of the patient as well as functional and nutritional status. Ms. Peery has never been married, but had one daughter whom has, sadly, recently passed away. Ms. Hawes does not know her daughter has passed. The patient was one of 7 children and is one of 2 only surviving - the patient and her sister/[name]. The patient enjoys knitting, painting, and used to enjoy  bicycling and going to church. Prior to hospitalization, the patient was living at [facility]. She was discharged there after her last hospitalization in June, prior to that the patient was living with [name]. When living with her sister, the patient was able to ambulate independently. The patient experienced decline and while at [facility], the patient needed at least one assist to ambulate. [name] tells me that the patient experienced multiple falls and hasn't been walking for the last two weeks. [name] tells me putting the patient at [facility] was hard for her because the patient continually  asks to come home - validation and emotional support was given to [name]. [name] last saw the patient at [facility] on Sunday - she said the patient was able to feed herself dinner and ate all of her food/drink.   We discussed patient's current illness and what it means in the larger context of patient's on-going co-morbidities. [name] had a basic understanding of the patient's current medical situation - updates provided and discussed possible new onset heart failure and patient's malnutrition. Natural disease trajectory and expectations at EOL were discussed. I attempted to elicit values and goals of care important to the patient. The difference between aggressive medical intervention and comfort care was considered in light of the patient's goals of care. We discussed that the patient may have a difficult time recovering from her current medical condition with her other chronic conditions. [name] tells me that she wants [place] to help the patient "as much as possible" and to "giver her every chance." Discussed full scope medical treatment with watchful waiting, which is reasonable - [name] was in agreement. If interventions are not working and/or if the patient doesn't seem to be improving she is open to hospice discussions in the future.  Hospice was explained and offered - [name] does not want to pursue hospice at this time.  Advance  directives, concepts specific to code status, and rehospitalization were considered and discussed. [name] stated the patient does not have a Living Will but does have a [place] will bring a copy for our records. [name] confirmed DNR/DNI status - saying that if the patient's heart and lungs naturally stop she did not wish for aggressive interventions and wanted the patient to pass peacefully. Educated that if the patient does improve enough for safe discharge that she is a high risk for rehospitalization. We discussed the concept of a comfort path and I encouraged [name] to think about at what point she would want to stop aggressive medical interventions for the patient and focus on helping the patient feel as best she can for as long as she can outside the hospital, with the goal of comfort rather than cure/prolonging life.  [name] is ok for the patient to return to [facility] on discharge depending on clinical course.  Visit also consisted of discussions dealing with the complex and emotionally intense issues of symptom management and palliative care in the setting of serious and potentially life-threatening illness. Palliative care team will continue to support patient, patient's family, and medical team.  Discussed with [name] the importance of continued conversation with the patient and medical providers regarding overall plan of care and treatment options, ensuring decisions are within the context of the patient's values and GOCs.    Questions and concerns were addressed. [name] was encouraged to call with questions and/or concerns. PMT number was provided.    Length of Stay: 1  Current Medications: Scheduled Meds:  . feeding supplement (NEPRO CARB STEADY)  237 mL Oral BID BM  . multivitamin with minerals  1 tablet Oral Daily  . pantoprazole (PROTONIX) IV  40 mg Intravenous BID    Continuous Infusions: . heparin 700 Units/hr (09/16/20 0332)    PRN Meds: acetaminophen **OR** acetaminophen,  albuterol, dextrose, LORazepam, polyethylene glycol, Resource ThickenUp Clear  Physical Exam Vitals and nursing note reviewed.  Constitutional:      General: She is sleeping. She is not in acute distress.    Appearance: She is ill-appearing.  Pulmonary:     Effort: No respiratory distress.  Skin:  General: Skin is warm and dry.             Vital Signs: BP (!) 154/50   Pulse 73   Temp 97.9 F (36.6 C) (Oral)   Resp 17   Ht $R'5\' 6"'Pb$  (1.676 m)   Wt 45.1 kg   SpO2 100%   BMI 16.05 kg/m  SpO2: SpO2: 100 % O2 Device: O2 Device: Nasal Cannula O2 Flow Rate: O2 Flow Rate (L/min): 2 L/min  Intake/output summary:   Intake/Output Summary (Last 24 hours) at 09/16/2020 1043 Last data filed at 09/16/2020 0044 Gross per 24 hour  Intake --  Output 1400 ml  Net -1400 ml   LBM: Last BM Date: 09/15/20 Baseline Weight: Weight: 47.2 kg Most recent weight: Weight: 45.1 kg       Palliative Assessment/Data: PPS 40-50%    Flowsheet Rows     Most Recent Value  Intake Tab  Referral Department Hospitalist  Unit at Time of Referral ER  Palliative Care Primary Diagnosis Cardiac  Date Notified 09/15/20  Reason for referral Clarify Goals of Care  Date of Admission 09/15/20  Date first seen by Palliative Care 09/15/20  # of days Palliative referral response time 0 Day(s)  # of days IP prior to Palliative referral 0  Clinical Assessment  Psychosocial & Spiritual Assessment  Palliative Care Outcomes  Patient/Family meeting held? No  [patient with intellectual disability, could not reach sister today]  Palliative Care Outcomes Other (Comment)  [will continue attempting to reach sister for [place] discussion]      Patient Active Problem List   Diagnosis Date Noted  . Anasarca 09/15/2020  . CKD (chronic kidney disease) stage 4, GFR 15-29 ml/min (HCC) 09/15/2020  . BMI less than 19,adult 09/15/2020  . DNR (do not resuscitate)   . DNI (do not intubate)   . Weakness   . Goals of care,  counseling/discussion   . Palliative care by specialist   . DNR (do not resuscitate) discussion   . Aspiration pneumonia ([place])   . Hypernatremia   . Acute renal failure (ARF) ([place]) 05/10/2020    Palliative Care Assessment & Plan   Patient Profile: 86 y.o. female  with past medical history of intellectual disability, diabetes mellitus type 2, CKD, CHF, iron deficiency anemia, and dysphagia presented to the ED on 09/15/20 from [place] for right sided facial and breast swelling.  ED course: Patient remained asymptomatic on arrival. Her vitals were stable. However she had mild fascial swelling (periorbital edema and swelling of right cheek) on exam as well as rt breast swelling and bilateral feet swelling. Significant lab results: Sodium 147, alkaline phosphatase 207, WBC 2.5, hemoglobin 8.9, BNP more than 4500, high-sensitivity troponin 55. CT chest without contrast showed anasarca, volume overload, right more than left pleural effusion. Also pericardial effusion (nearly 2 cm along the inferior heart), asymmetric severe right side chest wall swelling persistent left SVC. She is being admitted for anasarca due to cor pulmonale suspected due to pulmonary embolism and work up for suspicious finding on prior bladder ultrasound.  Of note, patient has been seen by PMT in June of this year. She is also being followed by [facility].  Assessment: Heart failure with preserved EF Iron deficiency anemia CKD stage 4 Diabetes Intellectual disability Hypernatremia Anasarca Pulmonary embolism Underweight  Recommendations/Plan:  Continue current full scope medical treatment with watchful waiting  Continue DNR/DNI as previously documented  Sister is hopeful the patient will show improvement and can return to  [employer], but depending on hospital course she is open to hospice discussions  Patient is already being followed with [facility]  Ongoing palliative discussions pending  clinical course  Utilize PRN medications for pain, anxiety, agitation   Obtain copy of HCPOA for records if it becomes available  PMT will continue to follow holistically  Goals of Care and Additional Recommendations:  Limitations on Scope of Treatment: Full Scope Treatment  Code Status:    Code Status Orders  (From admission, onward)         Start     Ordered   09/15/20 0824  Do not attempt resuscitation (DNR)  Continuous       Question Answer Comment  In the event of cardiac or respiratory ARREST Do not call a "code blue"   In the event of cardiac or respiratory ARREST Do not perform Intubation, CPR, defibrillation or ACLS   In the event of cardiac or respiratory ARREST Use medication by any route, position, wound care, and other measures to relive pain and suffering. May use oxygen, suction and manual treatment of airway obstruction as needed for comfort.      09/15/20 0829        Code Status History    Date Active Date Inactive Code Status Order ID Comments User Context   05/15/2020 1534 05/17/2020 1952 DNR [id number]  Drue Novel, NP Inpatient   05/10/2020 1746 05/15/2020 1534 Full Code [id number]  Neva Seat, MD ED   Advance Care Planning Activity       Prognosis:   Unable to determine   Discharge Planning:  To Be Determined  Care plan was discussed with patient's sister, primary RN, TOC, Dr. Shon Baton  Thank you for allowing the Palliative Medicine Team to assist in the care of this patient.   Total Time 74 minutes Prolonged Time Billed  yes       Greater than 50%  of this time was spent counseling and coordinating care related to the above assessment and plan.  Lin Landsman, NP  Please contact Palliative Medicine Team phone at [phone] for questions and concerns.

## 2020-09-16 NOTE — Progress Notes (Signed)
ANTICOAGULATION CONSULT NOTE - Follow Up Consult  Pharmacy Consult for Heparin Indication: suspected PE  No Known Allergies  Patient Measurements: Height: 5\' 6"  (167.6 cm) Weight: 45.1 kg (99 lb 6.8 oz) IBW/kg (Calculated) : 59.3 Heparin Dosing Weight: 47.2 kg  Vital Signs: Temp: 97.9 F (36.6 C) (10/30 0729) Temp Source: Oral (10/30 0729) BP: 154/50 (10/30 1004) Pulse Rate: 73 (10/30 1004)  Labs: Recent Labs    09/15/20 0203 09/15/20 0426 09/15/20 0626 09/16/20 0137 09/16/20 1100  HGB 8.9*  --   --  9.5*  --   HCT 31.7*  --   --  32.8*  --   PLT 151  --   --  163  --   LABPROT  --   --   --  14.8  --   INR  --   --   --  1.2  --   HEPARINUNFRC  --   --   --  0.94* 0.84*  CREATININE 2.58*  --   --  2.45*  --   TROPONINIHS  --  55* 49*  --   --     Estimated Creatinine Clearance: 12.2 mL/min (A) (by C-G formula based on SCr of 2.45 mg/dL (H)).   Assessment: 86yo female on heparin gtt for suspected PE. Remains supratherapeutic despite decreased dose this morning to 700 units/hr. Pt has significant renal dysfunction with current Scr 2.45 and current weight 45.1 kg. Hgb is stable, platelets wnl. No gtt issues or bleeding per RN.   Goal of Therapy:  Heparin level 0.3-0.7 units/ml Monitor platelets by anticoagulation protocol: Yes   Plan:  Will decrease heparin another 2 units/kg/hr to 600 units/hr Recheck level in 8 hours Daily heparin level, CBC Monitor s/sx bleeding  Rebbeca Paul, PharmD PGY1 Pharmacy Resident 09/16/2020 1:22 PM  Please check [email or web address] for unit-specific pharmacy phone numbers.

## 2020-09-16 NOTE — Progress Notes (Signed)
  Date: 09/16/2020  Patient name: Tiffany Bridges  Medical record number: [id number]  Date of birth: [birth year]   This patient's plan of care was discussed with the house staff. Please see Dr. Philip Aspen note for complete details. I concur with their findings.   Sid Falcon, MD 09/16/2020, 7:48 PM

## 2020-09-16 NOTE — Progress Notes (Addendum)
Initial Nutrition Assessment  RD working remotely.  DOCUMENTATION CODES:   Underweight  INTERVENTION:   -Nepro Shake po BID, each supplement provides 425 kcal and 19 grams protein -Magic cup TID with meals, each supplement provides 290 kcal and 9 grams of protein -MVI with minerals daily -Feeding assistance with meals  NUTRITION DIAGNOSIS:   Increased nutrient needs related to chronic illness (CKD) as evidenced by estimated needs.  GOAL:   Patient will meet greater than or equal to 90% of their needs  MONITOR:   PO intake, Supplement acceptance, Diet advancement, Labs, Weight trends, Skin, I & O's  REASON FOR ASSESSMENT:   Consult Assessment of nutrition requirement/status  ASSESSMENT:   86 year old female with past medical history of HFpEF, CKD 4, IDA, DM 2, presented from [place] with facial and rt breast swelling few hours after she had dinner. She does not have any complaints and no respiratory distress. VS stable. Sodium 147, alkaline phosphatase 207, WBC 2.5, hemoglobin 8.9, BNP more than 4500, high-sensitivity troponin 55.CT chest without contrast showed anasarca, volume overload, right more than left pleural effusion.  Also pericardial effusion (nearly 2 cm along the inferior heart), asymmetric severe right side chest wall swelling persistent left SVC.  Pt admitted with anasarca.   Reviewed I/O's: -1.4 L x 24 hours  UOP: 1.4 L x 24 hours  Attempted to speak with pt via call to hospital room phone, however, unable to reach.   No meal completion records available to assess at this time. Noted pt is from [facility]. She is currently on a dysphagia 1 diet with nectar thick liquids.   Reviewed wt hx; pt wt has been stable over the past 4 months. However, pt is deep pitting edema, which is likely masking further weight loss and well as fat and muscle depletions. Highly suspect pt with malnutrition, however, unable to identify at this time. Pt would greatly  benefit from addition of oral nutrition supplements. RD will trial Nepro (per PTA MAR, pt receives Glucerna), due to nectar thickness and increased nutrient density.   Palliative care following for further goals of care discussions.   Albumin has a half-life of 21 days and is strongly affected by stress response and inflammatory process, therefore, do not expect to see an improvement in this lab value during acute hospitalization. When a patient presents with low albumin, it is likely skewed due to the acute inflammatory response.  Unless it is suspected that patient had poor PO intake or malnutrition prior to admission, then RD should not be consulted solely for low albumin. Note that low albumin is no longer used to diagnose malnutrition;  uses the new malnutrition guidelines published by the American Society for Parenteral and Enteral Nutrition (A.S.P.E.N.) and the Academy of Nutrition and Dietetics (AND).    Lab Results  Component Value Date   HGBA1C 7.3 (H) 09/15/2020   PTA DM medications are 5 mg linagliptin daily.   Labs reviewed: CBGS: 76-159 (inpatient orders for glycemic control are none).   Diet Order:   Diet Order            DIET - DYS 1 Room service appropriate? Yes; Fluid consistency: Nectar Thick  Diet effective now                 EDUCATION NEEDS:   No education needs have been identified at this time  Skin:  Skin Assessment: Reviewed RN Assessment  Last BM:  09/15/20  Height:   Ht  Readings from Last 1 Encounters:  09/15/20 5\' 6"  (1.676 m)    Weight:   Wt Readings from Last 1 Encounters:  09/16/20 45.1 kg    Ideal Body Weight:  59.1 kg  BMI:  Body mass index is 16.05 kg/m.  Estimated Nutritional Needs:   Kcal:  1600-1800  Protein:  75-90 grams  Fluid:  > 1.6 L    Loistine Chance, RD, LDN, [place] Registered Dietitian II Certified Diabetes Care and Education Specialist Please refer to [facility] for RD and/or RD on-call/weekend/after hours  pager

## 2020-09-16 NOTE — Progress Notes (Signed)
VASCULAR LAB    Bilateral lower extremity venous duplex has been performed.  See CV proc for preliminary results.   Jerame Hedding, RVT 09/16/2020, 1:21 PM

## 2020-09-16 NOTE — Progress Notes (Signed)
ANTICOAGULATION CONSULT NOTE - Follow Up Consult  Pharmacy Consult for Heparin Indication: suspected PE  Assessment: 86yo female on heparin gtt for suspected PE.  Heparin level this evening 0.57 units/ml  Goal of Therapy:  Heparin level 0.3-0.7 units/ml Monitor platelets by anticoagulation protocol: Yes   Plan:  Continue heparin at 600 units/hr Daily heparin level, CBC Monitor s/sx bleeding  Thanks for allowing pharmacy to be a part of this patient's care.  Excell Seltzer, PharmD Clinical Pharmacist

## 2020-09-17 ENCOUNTER — Encounter (HOSPITAL_COMMUNITY): Payer: Self-pay | Admitting: Internal Medicine

## 2020-09-17 LAB — CBC
HCT: 35.9 % — ABNORMAL LOW (ref 36.0–46.0)
Hemoglobin: 10 g/dL — ABNORMAL LOW (ref 12.0–15.0)
MCH: 22.6 pg — ABNORMAL LOW (ref 26.0–34.0)
MCHC: 27.9 g/dL — ABNORMAL LOW (ref 30.0–36.0)
MCV: 81.2 fL (ref 80.0–100.0)
Platelets: 142 10*3/uL — ABNORMAL LOW (ref 150–400)
RBC: 4.42 MIL/uL (ref 3.87–5.11)
RDW: 16.7 % — ABNORMAL HIGH (ref 11.5–15.5)
WBC: 2.6 10*3/uL — ABNORMAL LOW (ref 4.0–10.5)
nRBC: 0 % (ref 0.0–0.2)

## 2020-09-17 LAB — HEPARIN LEVEL (UNFRACTIONATED): Heparin Unfractionated: 0.5 IU/mL (ref 0.30–0.70)

## 2020-09-17 LAB — GLUCOSE, CAPILLARY
Glucose-Capillary: 94 mg/dL (ref 70–99)
Glucose-Capillary: 112 mg/dL — ABNORMAL HIGH (ref 70–99)
Glucose-Capillary: 98 mg/dL (ref 70–99)
Glucose-Capillary: 122 mg/dL — ABNORMAL HIGH (ref 70–99)

## 2020-09-17 LAB — MRSA PCR SCREENING: MRSA by PCR: NEGATIVE

## 2020-09-17 MED ORDER — ORAL CARE MOUTH RINSE
15.0000 mL | Freq: Two times a day (BID) | OROMUCOSAL | Status: DC
  Administered 2020-09-18 – 2020-09-20 (×4): 15 mL via OROMUCOSAL

## 2020-09-17 MED ORDER — PANTOPRAZOLE SODIUM 40 MG PO TBEC
40.0000 mg | DELAYED_RELEASE_TABLET | Freq: Two times a day (BID) | ORAL | Status: DC
  Administered 2020-09-17 – 2020-09-18 (×2): 40 mg via ORAL
  Filled 2020-09-17 (×2): qty 1

## 2020-09-17 MED ORDER — AMLODIPINE BESYLATE 5 MG PO TABS
5.0000 mg | ORAL_TABLET | Freq: Every day | ORAL | Status: DC
  Administered 2020-09-17: 5 mg via ORAL
  Filled 2020-09-17: qty 1

## 2020-09-17 NOTE — Progress Notes (Signed)
HD#2 Subjective:   No acute overnight events. States she wants water this morning.  Objective:   Vital signs in last 24 hours: Vitals:   09/16/20 1004 09/16/20 1419 09/16/20 2039 09/17/20 0350  BP: (!) 154/50 (!) 175/75 (!) 189/87 (!) 156/72  Pulse: 73  72 70  Resp: 17  19 20   Temp:   98 F (36.7 C) 97.6 F (36.4 C)  TempSrc:   Oral Oral  SpO2: 100%  100% 98%  Weight:    45.2 kg  Height:        Physical Exam General: chronically ill appearing female Cardiac: RRR, low lower extremity edema Pulm: breathing comfortably on room air. lungs clear anteriorly  Filed Weights   09/15/20 1400 09/16/20 0347 09/17/20 0350  Weight: 47.2 kg 45.1 kg 45.2 kg    Intake/Output Summary (Last 24 hours) at 09/17/2020 0658 Last data filed at 09/17/2020 0353 Gross per 24 hour  Intake 619.32 ml  Output 700 ml  Net -80.68 ml   Net IO Since Admission: -1,480.68 mL [09/17/20 0658]  Pertinent Labs: CBC Latest Ref Rng & Units 09/16/2020 09/15/2020 05/16/2020  WBC 4.0 - 10.5 K/uL 2.3(L) 2.5(L) 9.0  Hemoglobin 12.0 - 15.0 g/dL 9.5(L) 8.9(L) 9.8(L)  Hematocrit 36 - 46 % 32.8(L) 31.7(L) 33.6(L)  Platelets 150 - 400 K/uL 163 151 346    CMP Latest Ref Rng & Units 09/16/2020 09/15/2020 05/16/2020  Glucose 70 - 99 mg/dL 108(H) 84 233(H)  BUN 8 - 23 mg/dL 47(H) 46(H) 38(H)  Creatinine 0.44 - 1.00 mg/dL 2.45(H) 2.58(H) 2.52(H)  Sodium 135 - 145 mmol/L 145 147(H) 146(H)  Potassium 3.5 - 5.1 mmol/L 4.1 4.4 3.4(L)  Chloride 98 - 111 mmol/L 109 111 113(H)  CO2 22 - 32 mmol/L 26 25 23   Calcium 8.9 - 10.3 mg/dL 8.4(L) 8.3(L) 8.0(L)  Total Protein 6.5 - 8.1 g/dL 5.9(L) 5.5(L) -  Total Bilirubin 0.3 - 1.2 mg/dL 0.8 0.4 -  Alkaline Phos 38 - 126 U/L 216(H) 207(H) -  AST 15 - 41 U/L 21 21 -  ALT 0 - 44 U/L 18 20 -      Assessment/Plan:   Active Problems:   Hypernatremia   Anasarca   CKD (chronic kidney disease) stage 4, GFR 15-29 ml/min (HCC)   BMI less than 19,adult   DNR (do not  resuscitate)   DNI (do not intubate)   Weakness   Anxiety   Encounter for hospice care discussion   Patient Summary: Tiffany Bridges is an 86 year old woman and [facility] participant with a significant past medical history of heart failure with preserved ejection fraction, CKD stage IV, iron deficiency anemia and diabetes type 2 who presented from SNF for right sided facial swelling and found to have cor pulmonale suspected due to pulmonary embolism.  This is hospital day 2.  Cor pulmonale suspected due to pulmonary embolism -hemodynamically stable.  Plan: --continue heparin --transition to eliquis today  HTN. Start amlodipine 5mg  daily. Prn hydralazine for SBP> 144mmHg.  Moderate neutropenia. Unclear etiology but new from June. White count stable since admission at 2.6.  Chronic anemia. Suspect ACD. Hgb at baseline today. Plan: consider referral to hematology at discharge for neurtropenia   Goals of Care  Palliation. On chart review, it appears that she has had fairly poor oral intake overall. I am concerned that she has failure to thrive and may have a poor overall prognosis.  Appreciate palliative care's assistance.  - Ativan 1 mg IV q4h PRN for anxiety/agitation  CKD Stage IV. At baseline. Monitor intermittently.  Underweight with BMI of 16.8 Nutrition following, appreciate recs: - Nepro Shake po BID, each supplement provides 425 kcal and 19 grams protein - Magic cup TID with meals, each supplement provides 290 kcal and 9 grams of protein - MVI with minerals daily - Feeding assistance with meals  Suspicious finding on prior bladder ultrasound: Bladder Ultrasound on June 2021 showed bladder mass versus debris in bladder on bladder ultrasound. Renal US 04/2020 showed questionable bladder mass 2.2 cm diameter recommend correlation with a Cystoscopy. Cyst identified within both kidneys, including a septated cyst at the peripelvic region of the RIGHT kidney 12 mm greatest  size. Apparently no further work up performed then. - May consider renal and bladder ultrasound this admission depending on ongoing [address] conversations  Type 2 diabetes mellitus. Home medications held due to hypoglycemia. Blood sugars are ranging in the 80s-110s without any insulin. Suspect this is related to her poor oral intake. Will continue to hold antihyperglycemics.  Diet: Dysphagia 1 with honey-thick liquids IVF: None,None VTE: Heparin Code: DNR   Dispo: Anticipated discharge back to [facility] in pending clinical improvement.   Please contact the on call pager after 5 pm and on weekends at [phone].  Mitzi Hansen, MD Internal Medicine Resident PGY-2 [facility] Pager: [phone] 09/17/2020 6:59 AM

## 2020-09-17 NOTE — Progress Notes (Signed)
Daily Progress Note   Patient Name: Tiffany Bridges       Date: 09/17/2020 DOB: Mar 23, 1934  Age: 86 y.o. MRN#: [id number] Attending Physician: Lucious Groves, DO Primary Care Physician: Willene Hatchet, NP Admit Date: 09/15/2020  Reason for Consultation/Follow-up: Establishing goals of care  Subjective: Chart review performed. Received report from primary RN - no acute concerns. RN states patient is eating and drinking most of her meal trays. Albumin noted to still be 2.9 on 09/16/20. Note that RN reports patient will call out for needs and then usually goes back to sleep.   Went to visit patient at bedside - no family/visitors present. Patient was lying in bed asleep. No signs or non-verbal gestures of pain or discomfort noted. No respiratory distress, increased work of breathing, or secretions noted. I did not attempt to wake up patient as she seemed to be resting comfortably.   Yesterday [name] was able to bring Legal Guardianship paperwork; however, it is an incomplete copy that only shows patient is an "incompetent person" - missing bottom half of form.   2:55 PM Attempted to call sister/[name] to discuss patient's clinical status today, continued concern for possible FTT, as well as thoughts on obtaining a renal US/work up for suspicious finding on previous bladder US and questionable bladder mass - no answer on home or mobile phone - confidential voicemail left and PMT phone number provided with request to return call.   Length of Stay: 2  Current Medications: Scheduled Meds:  . amLODipine  5 mg Oral Daily  . feeding supplement (NEPRO CARB STEADY)  237 mL Oral BID BM  . multivitamin with minerals  1 tablet Oral Daily  . pantoprazole  40 mg Oral BID    Continuous Infusions: .  heparin 600 Units/hr (09/17/20 0107)    PRN Meds: acetaminophen **OR** acetaminophen, albuterol, dextrose, LORazepam, polyethylene glycol, Resource ThickenUp Clear  Physical Exam Vitals and nursing note reviewed.  Constitutional:      General: She is not in acute distress.    Appearance: She is ill-appearing.     Comments: Frail appearing   Pulmonary:     Effort: No respiratory distress.  Skin:    General: Skin is warm and dry.  Neurological:     Motor: Weakness present.  Vital Signs: BP (!) 156/72 (BP Location: Right Arm)   Pulse 70   Temp 97.6 F (36.4 C) (Oral)   Resp 20   Ht 5\' 6"  (1.676 m)   Wt 45.2 kg   SpO2 98%   BMI 16.08 kg/m  SpO2: SpO2: 98 % O2 Device: O2 Device: Room Air O2 Flow Rate: O2 Flow Rate (L/min): 2 L/min  Intake/output summary:   Intake/Output Summary (Last 24 hours) at 09/17/2020 1445 Last data filed at 09/17/2020 0900 Gross per 24 hour  Intake 859.32 ml  Output 700 ml  Net 159.32 ml   LBM: Last BM Date: 09/15/20 Baseline Weight: Weight: 47.2 kg Most recent weight: Weight: 45.2 kg       Palliative Assessment/Data: PPS 40-50%    Flowsheet Rows     Most Recent Value  Intake Tab  Referral Department Hospitalist  Unit at Time of Referral ER  Palliative Care Primary Diagnosis Cardiac  Date Notified 09/15/20  Reason for referral Clarify Goals of Care  Date of Admission 09/15/20  Date first seen by Palliative Care 09/15/20  # of days Palliative referral response time 0 Day(s)  # of days IP prior to Palliative referral 0  Clinical Assessment  Psychosocial & Spiritual Assessment  Palliative Care Outcomes  Patient/Family meeting held? Yes  Who was at the meeting? sister  Palliative Care Outcomes Improved non-pain symptom therapy, Clarified goals of care, Counseled regarding hospice, Provided psychosocial or spiritual support  Patient/Family wishes: Interventions discontinued/not started  Mechanical Ventilation, BiPAP       Patient Active Problem List   Diagnosis Date Noted  . Anxiety   . Encounter for hospice care discussion   . Anasarca 09/15/2020  . CKD (chronic kidney disease) stage 4, GFR 15-29 ml/min (HCC) 09/15/2020  . BMI less than 19,adult 09/15/2020  . DNR (do not resuscitate)   . DNI (do not intubate)   . Weakness   . Goals of care, counseling/discussion   . Palliative care by specialist   . DNR (do not resuscitate) discussion   . Aspiration pneumonia ([place])   . Hypernatremia   . Acute renal failure (ARF) ([place]) 05/10/2020    Palliative Care Assessment & Plan   Patient Profile: 86 y.o.femalewith past medical history of intellectual disability, diabetes mellitus type 2, CKD, CHF, iron deficiency anemia, and dysphagia presented to the ED on 09/15/20 from [place] for right sided facial and breast swelling.  ED course:Patient remained asymptomatic on arrival. Her vitals were stable. However she had mild fascial swelling (periorbital edema and swelling of right cheek) on exam as well as rt breast swelling and bilateral feet swelling. Significant lab results: Sodium 147, alkaline phosphatase 207, WBC 2.5, hemoglobin 8.9, BNP more than 4500, high-sensitivity troponin 55. CT chest without contrast showed anasarca, volume overload, right more than left pleural effusion. Also pericardial effusion (nearly 2 cm along the inferior heart), asymmetric severe right side chest wall swelling persistent left SVC.She is being admitted for anasarca due to cor pulmonale suspected due to pulmonary embolismand work up for suspicious finding on prior bladder ultrasound.  Of note, patient has been seen by PMT in June of this year. She is also being followed by [facility].  Assessment: Heart failure with preserved EF Iron deficiency anemia CKD stage 4 Diabetes Intellectual disability Hypernatremia Anasarca Pulmonary embolism Underweight  Recommendations/Plan:  Continue current full  scope medical treatment  Continue DNR/DNI as previously documented  Attempted to reach sister/[name] for continued [place] conversations around concern  for FTT/malnutrition as well as questionable bladder mass and workup - no answer - will continue to reach out  Half of Legal Guardianship paperwork was obtained - copy made and will be scanned into EHR. Form does not show who has been named Legal Guardian but does show patient is deemed an "incompetent person" - request [name] provide complete copy of paperwork if possible  PMT will continue to follow holistically  Goals of Care and Additional Recommendations:  Limitations on Scope of Treatment: Full Scope Treatment  Code Status:    Code Status Orders  (From admission, onward)         Start     Ordered   09/15/20 0824  Do not attempt resuscitation (DNR)  Continuous       Question Answer Comment  In the event of cardiac or respiratory ARREST Do not call a "code blue"   In the event of cardiac or respiratory ARREST Do not perform Intubation, CPR, defibrillation or ACLS   In the event of cardiac or respiratory ARREST Use medication by any route, position, wound care, and other measures to relive pain and suffering. May use oxygen, suction and manual treatment of airway obstruction as needed for comfort.      09/15/20 0829        Code Status History    Date Active Date Inactive Code Status Order ID Comments User Context   05/15/2020 1534 05/17/2020 1952 DNR [id number]  Drue Novel, NP Inpatient   05/10/2020 1746 05/15/2020 1534 Full Code [id number]  Neva Seat, MD ED   Advance Care Planning Activity       Prognosis:   Poor   Discharge Planning:  Back to [facility] to continue following  Care plan was discussed with primary RN, Dr. Darrick Meigs  Thank you for allowing the Palliative Medicine Team to assist in the care of this patient.   Total Time 27 minutes Prolonged Time Billed  no       Greater  than 50%  of this time was spent counseling and coordinating care related to the above assessment and plan.  Lin Landsman, NP  Please contact Palliative Medicine Team phone at [phone] for questions and concerns.

## 2020-09-17 NOTE — Progress Notes (Signed)
ANTICOAGULATION CONSULT NOTE - Follow Up Consult  Pharmacy Consult for Heparin Indication: suspected PE  No Known Allergies  Patient Measurements: Height: 5\' 6"  (167.6 cm) Weight: 45.2 kg (99 lb 10.4 oz) IBW/kg (Calculated) : 59.3 Heparin Dosing Weight: 47.2 kg  Vital Signs: Temp: 97.6 F (36.4 C) (10/31 0350) Temp Source: Oral (10/31 0350) BP: 156/72 (10/31 0350) Pulse Rate: 70 (10/31 0350)  Labs: Recent Labs    09/15/20 0203 09/15/20 0203 09/15/20 0426 09/15/20 0626 09/16/20 0137 09/16/20 0137 09/16/20 1100 09/16/20 2106 09/17/20 0751  HGB 8.9*   < >  --   --  9.5*  --   --   --  10.0*  HCT 31.7*  --   --   --  32.8*  --   --   --  35.9*  PLT 151  --   --   --  163  --   --   --  142*  LABPROT  --   --   --   --  14.8  --   --   --   --   INR  --   --   --   --  1.2  --   --   --   --   HEPARINUNFRC  --   --   --   --  0.94*   < > 0.84* 0.57 0.50  CREATININE 2.58*  --   --   --  2.45*  --   --   --   --   TROPONINIHS  --   --  55* 49*  --   --   --   --   --    < > = values in this interval not displayed.    Estimated Creatinine Clearance: 12.2 mL/min (A) (by C-G formula based on SCr of 2.45 mg/dL (H)).   Assessment: 86yo female on heparin gtt for suspected PE. Heparin level remains therapeutic today on 600 units/hr. CBC is stable. No bleeding noted.    Goal of Therapy:  Heparin level 0.3-0.7 units/ml Monitor platelets by anticoagulation protocol: Yes   Plan:  Continue heparin 600 units/hr Daily heparin level, CBC Monitor s/sx bleeding  Rebbeca Paul, PharmD PGY1 Pharmacy Resident 09/17/2020 11:22 AM  Please check [email or web address] for unit-specific pharmacy phone numbers.

## 2020-09-18 ENCOUNTER — Inpatient Hospital Stay (HOSPITAL_COMMUNITY): Payer: Medicare (Managed Care)

## 2020-09-18 DIAGNOSIS — R627 Adult failure to thrive: Secondary | ICD-10-CM | POA: Diagnosis present

## 2020-09-18 DIAGNOSIS — E440 Moderate protein-calorie malnutrition: Secondary | ICD-10-CM | POA: Diagnosis present

## 2020-09-18 DIAGNOSIS — I2609 Other pulmonary embolism with acute cor pulmonale: Principal | ICD-10-CM

## 2020-09-18 LAB — GLUCOSE, CAPILLARY
Glucose-Capillary: 141 mg/dL — ABNORMAL HIGH (ref 70–99)
Glucose-Capillary: 93 mg/dL (ref 70–99)
Glucose-Capillary: 81 mg/dL (ref 70–99)
Glucose-Capillary: 113 mg/dL — ABNORMAL HIGH (ref 70–99)
Glucose-Capillary: 132 mg/dL — ABNORMAL HIGH (ref 70–99)
Glucose-Capillary: 142 mg/dL — ABNORMAL HIGH (ref 70–99)

## 2020-09-18 LAB — PROTIME-INR
INR: 0.9 (ref 0.8–1.2)
Prothrombin Time: 12.2 s (ref 11.4–15.2)

## 2020-09-18 LAB — HEPARIN LEVEL (UNFRACTIONATED): Heparin Unfractionated: 0.5 IU/mL (ref 0.30–0.70)

## 2020-09-18 LAB — CBC
HCT: 34.2 % — ABNORMAL LOW (ref 36.0–46.0)
Hemoglobin: 9.6 g/dL — ABNORMAL LOW (ref 12.0–15.0)
MCH: 22.8 pg — ABNORMAL LOW (ref 26.0–34.0)
MCHC: 28.1 g/dL — ABNORMAL LOW (ref 30.0–36.0)
MCV: 81.2 fL (ref 80.0–100.0)
Platelets: 178 10*3/uL (ref 150–400)
RBC: 4.21 MIL/uL (ref 3.87–5.11)
RDW: 16.9 % — ABNORMAL HIGH (ref 11.5–15.5)
WBC: 4.7 10*3/uL (ref 4.0–10.5)
nRBC: 0 % (ref 0.0–0.2)

## 2020-09-18 MED ORDER — WARFARIN SODIUM 2.5 MG PO TABS
2.5000 mg | ORAL_TABLET | Freq: Once | ORAL | Status: AC
  Administered 2020-09-18: 2.5 mg via ORAL
  Filled 2020-09-18: qty 1

## 2020-09-18 MED ORDER — AMLODIPINE BESYLATE 10 MG PO TABS
10.0000 mg | ORAL_TABLET | Freq: Every day | ORAL | Status: DC
  Administered 2020-09-18: 10 mg via ORAL
  Filled 2020-09-18: qty 1

## 2020-09-18 MED ORDER — WARFARIN - PHARMACIST DOSING INPATIENT: Freq: Every day | Status: DC

## 2020-09-18 MED ORDER — PANTOPRAZOLE SODIUM 40 MG IV SOLR
40.0000 mg | Freq: Two times a day (BID) | INTRAVENOUS | Status: DC
  Administered 2020-09-18 – 2020-09-19 (×2): 40 mg via INTRAVENOUS
  Filled 2020-09-18 (×2): qty 40

## 2020-09-18 NOTE — Progress Notes (Addendum)
Nutrition Follow-up  DOCUMENTATION CODES:   Non-severe (moderate) malnutrition in context of chronic illness, Underweight  INTERVENTION:   -Continue Nepro Shake po BID, each supplement provides 425 kcal and 19 grams protein -Continue Magic cup TID with meals, each supplement provides 290 kcal and 9 grams of protein -Continue MVI with minerals daily -Continue feeding assistance with meals  NUTRITION DIAGNOSIS:   Moderate Malnutrition related to chronic illness (CKD) as evidenced by mild fat depletion, moderate fat depletion, moderate muscle depletion, severe muscle depletion.  Ongoing  GOAL:   Patient will meet greater than or equal to 90% of their needs  Progressing   MONITOR:   PO intake, Supplement acceptance, Labs, Weight trends, Skin, I & O's  REASON FOR ASSESSMENT:   Consult Assessment of nutrition requirement/status  ASSESSMENT:   86 year old female with past medical history of HFpEF, CKD 4, IDA, DM 2, presented from [place] with facial and rt breast swelling few hours after she had dinner. She does not have any complaints and no respiratory distress. VS stable. Sodium 147, alkaline phosphatase 207, WBC 2.5, hemoglobin 8.9, BNP more than 4500, high-sensitivity troponin 55.CT chest without contrast showed anasarca, volume overload, right more than left pleural effusion.  Also pericardial effusion (nearly 2 cm along the inferior heart), asymmetric severe right side chest wall swelling persistent left SVC.  Reviewed I/O's: +24 ml x 24 hours and -1.5 L since admission  UOP: 600 ml x 24 hours  Pt not very interactive with RD. Pt attempting to cover up with blankets when performing nutrition-focused physical exam.   Per meal completion records, PO: 80%. Pt is accepting Nepro supplements per MAR.   Palliative care continues to follow for goals of care discussions with family.  Labs reviewed: CBGS: 81-113 (inpatient orders for glycemic control are none).   NUTRITION  - FOCUSED PHYSICAL EXAM:    Most Recent Value  Orbital Region Moderate depletion  Upper Arm Region Severe depletion  Thoracic and Lumbar Region Moderate depletion  Buccal Region Moderate depletion  Temple Region Moderate depletion  Clavicle Bone Region Severe depletion  Clavicle and Acromion Bone Region Moderate depletion  Scapular Bone Region Moderate depletion  Dorsal Hand Severe depletion  Patellar Region Severe depletion  Anterior Thigh Region Severe depletion  Posterior Calf Region Severe depletion  Edema (RD Assessment) None  Hair Reviewed  Eyes Reviewed  Mouth Reviewed  Skin Reviewed  Nails Reviewed       Diet Order:   Diet Order            DIET - DYS 1 Room service appropriate? Yes with Assist; Fluid consistency: Nectar Thick  Diet effective now                 EDUCATION NEEDS:   No education needs have been identified at this time  Skin:  Skin Assessment: Reviewed RN Assessment  Last BM:  09/18/20  Height:   Ht Readings from Last 1 Encounters:  09/15/20 5\' 6"  (1.676 m)    Weight:   Wt Readings from Last 1 Encounters:  09/17/20 45.2 kg    Ideal Body Weight:  59.1 kg  BMI:  Body mass index is 16.08 kg/m.  Estimated Nutritional Needs:   Kcal:  1600-1800  Protein:  75-90 grams  Fluid:  > 1.6 L    Loistine Chance, RD, LDN, [place] Registered Dietitian II Certified Diabetes Care and Education Specialist Please refer to [facility] for RD and/or RD on-call/weekend/after hours pager

## 2020-09-18 NOTE — Significant Event (Addendum)
Provider was paged due to dropping and oxygen status to the 80s.  Her oxygen status was 96% on room air this morning.  Per RN, patient just finished eating lunch and starting to make gurgling noise and cough.  Her oxygen increased to 91% when put on 2 L of oxygen.  When evaluated patient at bedside, she appears in mild distress with harsh coughing.  She expectorated minimal volume of white sputum.  Attempted using oral suction but unsuccessful due to patient's uncooperation.  Heart sounds normal.  Harsh anterior lung sounds likely due to mucous block.  Her dyspnea is likely due to aspiration.  Patient does have moderate oral dysphagia per swallow study.  Obtain stat chest x-ray to evaluate for aspiration pneumonia and her right pleural effusion.  Other differentials include worsening pleural effusion, cardiac tamponade or pulmonary embolism.    -Continue patient on 2 L of oxygen to maintain her oxygen status.   -Continue to monitor for vital signs and worsening oxygen status. -Pending chest x-ray  Addendum: Chest x-ray shows mild bibasilar atelectasis with minimal bilateral pleural effusion.  Her right pleural effusion improved.  Will continue patient on oxygen.  If she started to develop fever, will start Unasyn for possible aspiration pneumonia.

## 2020-09-18 NOTE — Progress Notes (Signed)
Subjective:   Hospital day: 3  Overnight event: No acute event overnight  Patient is seen at bedside.  She is nonverbal and only gives signs to answer questions.  She also avoid conversation and use a blanket to cover her face during examination.  Reports that she feels well. Denies being in pain this morning. States that she has not had a recent bowel movement. Otherwise, patient has no new complaints.   Nonsuccessful attempt to contact her legal guardian [name].  Objective:  Vital signs in last 24 hours: Vitals:   09/17/20 1559 09/17/20 2100 09/18/20 0458 09/18/20 1006  BP: (!) 162/68 (!) 135/49 (!) 179/70 (!) 162/87  Pulse: 81 76 81   Resp: (!) 21 19 13 16   Temp: 98 F (36.7 C) (!) 97.5 F (36.4 C) 98 F (36.7 C) (!) 97.4 F (36.3 C)  TempSrc: Oral Oral Oral Oral  SpO2: 95% 95% 96%   Weight:      Height:       CBC Latest Ref Rng & Units 09/17/2020 09/16/2020 09/15/2020  WBC 4.0 - 10.5 K/uL 2.6(L) 2.3(L) 2.5(L)  Hemoglobin 12.0 - 15.0 g/dL 10.0(L) 9.5(L) 8.9(L)  Hematocrit 36 - 46 % 35.9(L) 32.8(L) 31.7(L)  Platelets 150 - 400 K/uL 142(L) 163 151   BMP Latest Ref Rng & Units 09/16/2020 09/15/2020 05/16/2020  Glucose 70 - 99 mg/dL 108(H) 84 233(H)  BUN 8 - 23 mg/dL 47(H) 46(H) 38(H)  Creatinine 0.44 - 1.00 mg/dL 2.45(H) 2.58(H) 2.52(H)  Sodium 135 - 145 mmol/L 145 147(H) 146(H)  Potassium 3.5 - 5.1 mmol/L 4.1 4.4 3.4(L)  Chloride 98 - 111 mmol/L 109 111 113(H)  CO2 22 - 32 mmol/L 26 25 23   Calcium 8.9 - 10.3 mg/dL 8.4(L) 8.3(L) 8.0(L)     Physical Exam Physical Exam Constitutional:      General: She is not in acute distress.    Comments: Patient is trying to cover her head with a blanket and avoid conversation during examination.  She is also not interested in talking with the provider. She is frail  HENT:     Head: Normocephalic.  Eyes:     General:        Right eye: No discharge.        Left eye: No discharge.  Cardiovascular:     Rate and Rhythm:  Normal rate and regular rhythm.  Pulmonary:     Effort: No respiratory distress.     Breath sounds: Normal breath sounds.  Abdominal:     General: Bowel sounds are normal.     Palpations: Abdomen is soft.     Tenderness: There is no abdominal tenderness.  Musculoskeletal:     Right lower leg: No edema.     Left lower leg: No edema.  Neurological:     Mental Status: She is alert.  Psychiatric:     Comments: Flat affect     Assessment/Plan: Ineze Serrao is a 86 y.o. female with from [facility] hx of heart failure preserved ejection fraction, CKD 4, IDA, diabetes type 2 presenting with facial and right breast swelling, found to have cor pulmonale secondary to possible PE.  Principal Problem:   Acute cor pulmonale (HCC) Active Problems:   Hypernatremia   Anasarca   CKD (chronic kidney disease) stage 4, GFR 15-29 ml/min (HCC)   BMI less than 19,adult   DNR (do not resuscitate)   DNI (do not intubate)   Weakness   Anxiety   Encounter for hospice care discussion  Failure to thrive in adult  Cor pulmonale secondary to possible pulmonary embolism Chest x-ray shows right pleural effusion with pericardial effusion of 2 cm thickness, also right chest wall swelling.  Echocardiogram shows a thrombus in the right pulmonary artery with severely elevated pulmonary artery systolic pressure of 60 mmmHg.  Ultrasound Dopplers negative for DVT.  CTA was not performed due to her CKD 4.  VQ scan was also deferred.  Patient was started on heparin infusion for treatment of her PE.  The cause of her PE can be related to her possible bladder mass.  Will transition to warfarin given her CKD 4.  I have spoken to [facility] and they will write an order for INR check over at the nursing home at [facility], where patient [facility].  Patient is currently hemodynamically stable and satting well on room air. -Continue heparin drip and start warfarin.    Hypertension Blood pressure still elevated at 179/70.  Increase  amlodipine to 10 mg daily.  Consider adding home metoprolol succinate 25 mg if continue to be hypertensive.   Pancytopenia WBC 2.6,  Hemoglobin 10 and platelet 142.  Unknown cause of his leukopenia, which is a new finding since June 2021.  Her hemoglobin is consistent with IDA, and at baseline.  Platelets also trending down.  Patient will need follow-up outpatient with her PCP and possible hematology referral for further evaluation. -CBC daily   Chronic kidney disease stage IV Her creatinine is at baseline 2.5.  She does not make good urine output and has a negative fluid status.  Patient is not on any diuretics.   -Trend BMP daily   Bladder mass BladderUltrasound on June 2021 showed bladder mass versus debris in bladder onbladderultrasound. Renal US 6/2021showed questionable bladder mass 2.2 cm diameter recommend correlation with a Cystoscopy. Cyst identified within both kidneys, including a septated cyst at the peripelvic region of the RIGHT kidney 12 mm greatest size.   -Await goal of care conversation with her legal guardian to determine if patient will proceed with more diagnostic testing such as cystoscopy for her bladder mass. -If continue full scope of treatment, patient will need a outpatient cystoscopy to rule out any malignancy.   Goal of care Palliative was consulted for her deconditioning and failure to thrive.  Palliative team has seen the patient but unable to reach out to patient's legal guardian [name] Smith.  Patient's paperwork was also incomplete.  I attempted to contact [name] today but unsuccessful and her voicemail is full.  We will follow with palliative care regarding this matter.   Diet: Nectar thick IVF: No VTE: Heparin CODE: DNR  Prior to Admission Living Arrangement: Nursing home at [facility] Anticipated Discharge Location: Nursing home [facility] Barriers to Discharge: Starting warfarin for PE Dispo: Anticipated discharge in approximately 1-2  day(s).   Gaylan Gerold, DO 09/18/2020, 12:13 PM Pager: [phone] After 5pm on weekdays and 1pm on weekends: On Call pager [phone]

## 2020-09-18 NOTE — Progress Notes (Signed)
Patient ate dinner with supervision, during meal swallowing without coughing, but after meal coughing noted with decreasing oxygen saturations.  Remains on [place] at 3L oxygen.  Dr. Lucinda Dell notified of possible silent aspiration, order placed to make NPO.

## 2020-09-18 NOTE — Progress Notes (Signed)
ANTICOAGULATION CONSULT NOTE - Follow Up Consult  Pharmacy Consult for Heparin Indication: suspected PE  No Known Allergies  Patient Measurements: Height: 5\' 6"  (167.6 cm) Weight: 45.2 kg (99 lb 10.4 oz) IBW/kg (Calculated) : 59.3 Heparin Dosing Weight: 47.2 kg  Vital Signs: Temp: 97.4 F (36.3 C) (11/01 1006) Temp Source: Oral (11/01 1006) BP: 162/87 (11/01 1006) Pulse Rate: 81 (11/01 0458)  Labs: Recent Labs    09/16/20 0137 09/16/20 0137 09/16/20 1100 09/16/20 2106 09/17/20 0751  HGB 9.5*  --   --   --  10.0*  HCT 32.8*  --   --   --  35.9*  PLT 163  --   --   --  142*  LABPROT 14.8  --   --   --   --   INR 1.2  --   --   --   --   HEPARINUNFRC 0.94*   < > 0.84* 0.57 0.50  CREATININE 2.45*  --   --   --   --    < > = values in this interval not displayed.    Estimated Creatinine Clearance: 12.2 mL/min (A) (by C-G formula based on SCr of 2.45 mg/dL (H)).   Assessment: 86yo female on heparin gtt for suspected PE. Heparin level not drawn yet this morning, she continues on 600 units/hr. No bleeding noted.    New orders to add warfarin. INR 1.2 on 10/30.  Goal of Therapy:  Heparin level 0.3-0.7 units/ml Monitor platelets by anticoagulation protocol: Yes   Plan:  Continue heparin 600 units/hr Daily heparin level, CBC Add warfarin 2.5mg  tonight  Erin Hearing PharmD., BCPS Clinical Pharmacist 09/18/2020 12:08 PM

## 2020-09-18 NOTE — Progress Notes (Signed)
Patient continued to cough, had small amounts of emesis, coarse breath sounds.  Placed on NRBM to get oxygen saturation up to 90s.  Respiratory rate in the 30s.  Dr. Lucinda Dell notified. Will order CXR.  Will continue to monitor respiratory status.

## 2020-09-18 NOTE — Progress Notes (Signed)
Daily Progress Note   Patient Name: Tiffany Bridges       Date: 09/18/2020 DOB: 12/23/1933  Age: 86 y.o. MRN#: [id number] Attending Physician: Aldine Contes, MD Primary Care Physician: Willene Hatchet, NP Admit Date: 09/15/2020  Reason for Consultation/Follow-up: continued [place] discussions  Subjective: 15:50--Chart reviewed and received reports from bedside RN. Nursing reports patient has been "eating well", but may have had an aspiration event today. Patient began to cough and make gurgling noises right after eating lunch.  Oxygen saturation dropped into the 80's, and then increased to 91% when patient placed on 2L of oxygen.   During my assessment, patient has oxygen saturation of 100% on 3L nasal cannula. She appears to be resting comfortably - does not awaken to voice or light touch.   19:13--I spoke with patient's sister/guardian ([name]) by phone and provided an update on patient's current medical condition. Expressed my concern regarding the even earlier today.  I introduced the concept of a comfort path to [name] and encouraged her to think about at what point she would want to stop full scope medical interventions and focus on comfort and dignity rather than prolonging life. [name] states she has thought about this but is not ready to make any decisions at this point.   Length of Stay: 3  Current Medications: Scheduled Meds:  . amLODipine  10 mg Oral Daily  . feeding supplement (NEPRO CARB STEADY)  237 mL Oral BID BM  . mouth rinse  15 mL Mouth Rinse BID  . multivitamin with minerals  1 tablet Oral Daily  . pantoprazole  40 mg Oral BID  . warfarin  2.5 mg Oral ONCE-1600  . Warfarin - Pharmacist Dosing Inpatient   Does not apply q1600    Continuous Infusions: . heparin 600  Units/hr (09/17/20 1800)    PRN Meds: acetaminophen **OR** acetaminophen, albuterol, dextrose, LORazepam, polyethylene glycol, Resource ThickenUp Clear  Physical Exam Vitals reviewed.  Constitutional:      General: She is not in acute distress.    Comments: Frail, chronically ill-appearing  Cardiovascular:     Rate and Rhythm: Normal rate and regular rhythm.  Pulmonary:     Effort: Pulmonary effort is normal.  Neurological:     Comments: Minimally responsive             Vital  Signs: BP (!) 162/87 (BP Location: Left Arm)   Pulse 81   Temp (!) 97.4 F (36.3 C) (Oral)   Resp 16   Ht 5\' 6"  (1.676 m)   Wt 45.2 kg   SpO2 96%   BMI 16.08 kg/m  SpO2: SpO2: 96 % O2 Device: O2 Device: Room Air O2 Flow Rate: O2 Flow Rate (L/min): 2 L/min  Intake/output summary:   Intake/Output Summary (Last 24 hours) at 09/18/2020 1558 Last data filed at 09/18/2020 0900 Gross per 24 hour  Intake 618 ml  Output 600 ml  Net 18 ml   LBM: Last BM Date: 09/18/20 Baseline Weight: Weight: 47.2 kg Most recent weight: Weight: 45.2 kg       Palliative Assessment/Data: PPS 40%     Patient Active Problem List   Diagnosis Date Noted  . Acute cor pulmonale ([place]) 09/18/2020  . Failure to thrive in adult 09/18/2020  . Anxiety   . Encounter for hospice care discussion   . Anasarca 09/15/2020  . CKD (chronic kidney disease) stage 4, GFR 15-29 ml/min (HCC) 09/15/2020  . BMI less than 19,adult 09/15/2020  . DNR (do not resuscitate)   . DNI (do not intubate)   . Weakness   . Goals of care, counseling/discussion   . Palliative care by specialist   . DNR (do not resuscitate) discussion   . Aspiration pneumonia ([place])   . Hypernatremia   . Acute renal failure (ARF) ([place]) 05/10/2020    Palliative Care Assessment & Plan   Patient Profile: 86 y.o.femalewith past medical history of intellectual disability, diabetes mellitus type 2, CKD, CHF, iron deficiency anemia, and dysphagia presented to the  ED on 09/15/20 from [place] for right sided facial and breast swelling.  ED course:Patient remained asymptomatic on arrival. Her vitals were stable. However she had mild fascial swelling (periorbital edema and swelling of right cheek) on exam as well as rt breast swelling and bilateral feet swelling. Significant lab results: Sodium 147, alkaline phosphatase 207, WBC 2.5, hemoglobin 8.9, BNP more than 4500, high-sensitivity troponin 55. CT chest without contrast showed anasarca, volume overload, right more than left pleural effusion. Also pericardial effusion (nearly 2 cm along the inferior heart), asymmetric severe right side chest wall swelling persistent left SVC.She is being admitted for anasarca due to cor pulmonale suspected due to pulmonary embolismand work up for suspicious finding on prior bladder ultrasound.  Of note, patient has been seen by PMT in June of this year. She is also being followed by [facility].  Assessment: Heart failure with preserved EF Iron deficiency anemia CKD stage 4 Diabetes Intellectual disability Hypernatremia Anasarca Pulmonary embolism Underweight  Recommendations/Plan: - continue current medical treatment - DNR/DNI as previously documented - PMT will continue to follow  Goals of Care and Additional Recommendations:  Limitations on Scope of Treatment: Full Scope Treatment  Code Status: DNR/DNI  Prognosis:   poor  Discharge Planning:  Back to [facility] with [facility] continuing to follow  Care plan was discussed with primary RN  Thank you for allowing the Palliative Medicine Team to assist in the care of this patient.   Total Time 25 minutes Prolonged Time Billed  no       Greater than 50%  of this time was spent counseling and coordinating care related to the above assessment and plan.  Lavena Bullion, NP  Please contact Palliative Medicine Team phone at [phone] for questions and concerns.

## 2020-09-18 NOTE — Progress Notes (Signed)
Patient oxygen saturation monitor alarming for sats in the 80s, coarse breath sounds upper lobes noted. Patient instructed to cough, and coughed several times.  Placed on Oxygen 2L Tiffany Bridges with sats slowly increasing to 91%.  Dr. Alfonse Spruce notified, will place order for CXR.  Will continue to monitor breathing.

## 2020-09-18 NOTE — Progress Notes (Signed)
UPDATE NOTE:  Paged again this evening by bedside RN who noted post prandial coughing after supper. She notes that pt was supervised with supper and did ok at that time however developed a similar episode as earlier today. She is maintaining her O2 saturations in the low 90s on the 2L supplemental oxygen.  Speech Therapy notes and recommendations reviewed. It is noted that she did not have any overt signs of aspiration with nectar thick liquids or thin liquids however pt did not cooperate fully with the evaluation so it was recommended that she remain on her prior Dysphagia 1 diet.  Assessment: suspect she is having some silent aspiration events which does fit her failure to thrive clinical picture. Palliative care has been consulted and has been attempting to contact family to discuss [place] however, she, along with my team, have been unable to do so. Pt remains hemodynamically stable at this time.  Plan:  --NPO at this time --request repeat speech evaluation in AM --if unable to tolerate oral intake due to aspiration, AC for her PE will be limited. Family will need to choose comfort measures vs PEG at that time. Her overall clinical picture favors a comfort care goal directed approach however will need some family involvement to make these decisions for pt.  Mitzi Hansen, MD Internal Medicine Resident PGY-2 [facility] Pager: [phone] 09/18/2020 6:46 PM

## 2020-09-19 ENCOUNTER — Inpatient Hospital Stay (HOSPITAL_COMMUNITY): Payer: Medicare (Managed Care)

## 2020-09-19 DIAGNOSIS — E440 Moderate protein-calorie malnutrition: Secondary | ICD-10-CM

## 2020-09-19 DIAGNOSIS — R1310 Dysphagia, unspecified: Secondary | ICD-10-CM

## 2020-09-19 DIAGNOSIS — D649 Anemia, unspecified: Secondary | ICD-10-CM

## 2020-09-19 LAB — BASIC METABOLIC PANEL
Anion gap: 11 (ref 5–15)
BUN: 47 mg/dL — ABNORMAL HIGH (ref 8–23)
CO2: 27 mmol/L (ref 22–32)
Calcium: 8.4 mg/dL — ABNORMAL LOW (ref 8.9–10.3)
Chloride: 101 mmol/L (ref 98–111)
Creatinine, Ser: 2.23 mg/dL — ABNORMAL HIGH (ref 0.44–1.00)
GFR, Estimated: 21 mL/min — ABNORMAL LOW (ref >60)
Glucose, Bld: 199 mg/dL — ABNORMAL HIGH (ref 70–99)
Potassium: 4.1 mmol/L (ref 3.5–5.1)
Sodium: 139 mmol/L (ref 135–145)

## 2020-09-19 LAB — CBC
HCT: 27 % — ABNORMAL LOW (ref 36.0–46.0)
HCT: 22.6 % — ABNORMAL LOW (ref 36.0–46.0)
Hemoglobin: 7.6 g/dL — ABNORMAL LOW (ref 12.0–15.0)
Hemoglobin: 6.4 g/dL — CL (ref 12.0–15.0)
MCH: 22.6 pg — ABNORMAL LOW (ref 26.0–34.0)
MCH: 23.2 pg — ABNORMAL LOW (ref 26.0–34.0)
MCHC: 28.1 g/dL — ABNORMAL LOW (ref 30.0–36.0)
MCHC: 28.3 g/dL — ABNORMAL LOW (ref 30.0–36.0)
MCV: 80.4 fL (ref 80.0–100.0)
MCV: 81.9 fL (ref 80.0–100.0)
Platelets: 139 10*3/uL — ABNORMAL LOW (ref 150–400)
Platelets: 124 10*3/uL — ABNORMAL LOW (ref 150–400)
RBC: 3.36 MIL/uL — ABNORMAL LOW (ref 3.87–5.11)
RBC: 2.76 MIL/uL — ABNORMAL LOW (ref 3.87–5.11)
RDW: 17.1 % — ABNORMAL HIGH (ref 11.5–15.5)
RDW: 17 % — ABNORMAL HIGH (ref 11.5–15.5)
WBC: 3.8 10*3/uL — ABNORMAL LOW (ref 4.0–10.5)
WBC: 5.5 10*3/uL (ref 4.0–10.5)
nRBC: 0 % (ref 0.0–0.2)
nRBC: 0 % (ref 0.0–0.2)

## 2020-09-19 LAB — HEMOGLOBIN AND HEMATOCRIT, BLOOD
HCT: 24.9 % — ABNORMAL LOW (ref 36.0–46.0)
Hemoglobin: 7 g/dL — ABNORMAL LOW (ref 12.0–15.0)

## 2020-09-19 LAB — GLUCOSE, CAPILLARY
Glucose-Capillary: 103 mg/dL — ABNORMAL HIGH (ref 70–99)
Glucose-Capillary: 157 mg/dL — ABNORMAL HIGH (ref 70–99)
Glucose-Capillary: 170 mg/dL — ABNORMAL HIGH (ref 70–99)
Glucose-Capillary: 194 mg/dL — ABNORMAL HIGH (ref 70–99)

## 2020-09-19 LAB — PROTIME-INR
INR: 1.1 (ref 0.8–1.2)
Prothrombin Time: 13.3 seconds (ref 11.4–15.2)

## 2020-09-19 LAB — PREPARE RBC (CROSSMATCH)

## 2020-09-19 LAB — HEPARIN LEVEL (UNFRACTIONATED): Heparin Unfractionated: 0.31 IU/mL (ref 0.30–0.70)

## 2020-09-19 LAB — ABO/RH: ABO/RH(D): A POS

## 2020-09-19 MED ORDER — INSULIN ASPART 100 UNIT/ML ~~LOC~~ SOLN
0.0000 [IU] | Freq: Three times a day (TID) | SUBCUTANEOUS | Status: DC
  Administered 2020-09-19: 2 [IU] via SUBCUTANEOUS

## 2020-09-19 MED ORDER — HALOPERIDOL LACTATE 2 MG/ML PO CONC
0.5000 mg | ORAL | Status: DC | PRN
  Filled 2020-09-19: qty 0.3

## 2020-09-19 MED ORDER — LACTATED RINGERS IV SOLN: INTRAVENOUS | Status: DC

## 2020-09-19 MED ORDER — HALOPERIDOL LACTATE 5 MG/ML IJ SOLN: 0.5000 mg | INTRAMUSCULAR | Status: DC | PRN

## 2020-09-19 MED ORDER — HALOPERIDOL 0.5 MG PO TABS
0.5000 mg | ORAL_TABLET | ORAL | Status: DC | PRN
  Filled 2020-09-19: qty 1

## 2020-09-19 MED ORDER — SODIUM CHLORIDE 0.9 % IV SOLN
1.5000 g | Freq: Two times a day (BID) | INTRAVENOUS | Status: DC
  Filled 2020-09-19: qty 4

## 2020-09-19 MED ORDER — GLYCOPYRROLATE 1 MG PO TABS
1.0000 mg | ORAL_TABLET | ORAL | Status: DC | PRN
  Filled 2020-09-19: qty 1

## 2020-09-19 MED ORDER — FENTANYL CITRATE (PF) 100 MCG/2ML IJ SOLN
50.0000 ug | INTRAMUSCULAR | Status: DC | PRN
  Administered 2020-09-19 (×2): 50 ug via INTRAVENOUS
  Filled 2020-09-19 (×2): qty 2

## 2020-09-19 MED ORDER — POLYVINYL ALCOHOL 1.4 % OP SOLN: 1.0000 [drp] | Freq: Four times a day (QID) | OPHTHALMIC | Status: DC | PRN

## 2020-09-19 MED ORDER — SODIUM CHLORIDE 0.9% IV SOLUTION: Freq: Once | INTRAVENOUS | Status: AC

## 2020-09-19 MED ORDER — BIOTENE DRY MOUTH MT LIQD: 15.0000 mL | OROMUCOSAL | Status: DC | PRN

## 2020-09-19 MED ORDER — GLYCOPYRROLATE 0.2 MG/ML IJ SOLN: 0.2000 mg | INTRAMUSCULAR | Status: DC | PRN

## 2020-09-19 NOTE — Discharge Summary (Signed)
Name: Tiffany Bridges MRN: [id number] DOB: 12/08/1933 86 y.o. PCP: Willene Hatchet, NP  Date of Admission: 09/15/2020 12:39 AM Date of Discharge: 09/20/2020 11: 50 a.m. Attending Physician: Aldine Contes, MD  Discharge Diagnosis: 1.  Acute cor pulmonale 2.  Pulmonary embolism 3.  Symptomatic anemia 4.  Dysphagia 5.  Failure to thrive  Discharge Medications: Allergies as of 09/20/2020   No Known Allergies     Medication List    STOP taking these medications   CENTRUM ADULTS PO   epoetin alfa-epbx 10000 UNIT/ML injection Commonly known as: RETACRIT   ferrous sulfate 325 (65 FE) MG tablet   furosemide 20 MG tablet Commonly known as: LASIX   glipiZIDE 5 MG tablet Commonly known as: GLUCOTROL   Glucerna Liqd   ipratropium-albuterol 0.5-2.5 (3) MG/3ML Soln Commonly known as: DUONEB   linagliptin 5 MG Tabs tablet Commonly known as: TRADJENTA   metoprolol succinate 25 MG 24 hr tablet Commonly known as: TOPROL-XL   oxybutynin 5 MG tablet Commonly known as: DITROPAN   pantoprazole 40 MG tablet Commonly known as: PROTONIX   Resource ThickenUp Clear Powd   sennosides-docusate sodium 8.6-50 MG tablet Commonly known as: SENOKOT-S     TAKE these medications   acetaminophen 325 MG tablet Commonly known as: TYLENOL Take 2 tablets (650 mg total) by mouth every 6 (six) hours as needed for mild pain (or Fever >/= 101). What changed: reasons to take this   acetaminophen 650 MG suppository Commonly known as: TYLENOL Place 1 suppository (650 mg total) rectally every 6 (six) hours as needed for mild pain (or Fever >/= 101). What changed: You were already taking a medication with the same name, and this prescription was added. Make sure you understand how and when to take each.   albuterol (2.5 MG/3ML) 0.083% nebulizer solution Commonly known as: PROVENTIL Take 3 mLs (2.5 mg total) by nebulization every 2 (two) hours as needed for wheezing.   antiseptic oral  rinse Liqd Apply 15 mLs topically as needed for dry mouth.   fentaNYL 100 MCG/2ML injection Commonly known as: SUBLIMAZE Inject 1 mL (50 mcg total) into the vein every 2 (two) hours as needed for severe pain (or dyspnea).   glycopyrrolate 1 MG tablet Commonly known as: ROBINUL Take 1 tablet (1 mg total) by mouth every 4 (four) hours as needed (excessive secretions).   glycopyrrolate 0.2 MG/ML injection Commonly known as: ROBINUL Inject 1 mL (0.2 mg total) into the skin every 4 (four) hours as needed (excessive secretions).   glycopyrrolate 0.2 MG/ML injection Commonly known as: ROBINUL Inject 1 mL (0.2 mg total) into the vein every 4 (four) hours as needed (excessive secretions).   haloperidol 0.5 MG tablet Commonly known as: HALDOL Take 1 tablet (0.5 mg total) by mouth every 4 (four) hours as needed for agitation (or delirium).   haloperidol 2 MG/ML solution Commonly known as: HALDOL Place 0.3 mLs (0.6 mg total) under the tongue every 4 (four) hours as needed for agitation (or delirium).   haloperidol lactate 5 MG/ML injection Commonly known as: HALDOL Inject 0.1 mLs (0.5 mg total) into the vein every 4 (four) hours as needed (or delirium).   LORazepam 2 MG/ML injection Commonly known as: ATIVAN Inject 0.5 mLs (1 mg total) into the vein every 4 (four) hours as needed for anxiety (agitation).   polyvinyl alcohol 1.4 % ophthalmic solution Commonly known as: LIQUIFILM TEARS Place 1 drop into both eyes 4 (four) times daily as needed for dry eyes.  Disposition and follow-up:   [name] was discharged from [facility] in Stable condition.  At the hospital follow up visit please address:  1.  Proceed with comfort measure  2.  Labs / imaging needed at time of follow-up: N/A  3.  Pending labs/ test needing follow-up: N/A  Follow-up Appointments:   Hospital Course: 1.  Patient presented with facial swelling and right breast swelling.  CT  chest shows right pleural effusion with pericardial effusion of 2 cm thickness, also right chest wall swelling.  Echocardiogram shows a thrombus in the right pulmonary artery with severely elevated pulmonary artery systolic pressure of 60 mmmHg.  Ultrasound Dopplers negative for DVT.  CTA was not performed due to her CKD 4.  VQ scan was also deferred.  Patient was started on heparin infusion for treatment of her PE.  The cause of her PE can be related to her bladder mass.  Attempt to transition to warfarin but unsuccessful due to dysphagia.  Patient had a swallowing study done on admission which shows moderate dysphagia.  However she continued to aspirate multiple times on nectar thick diet and suspected to have aspiration pneumonia.  Repeat swallowing study shows worsening dysphagia.  Her hemoglobin was 10 on admission and dropped to 6.4 in 3 days.  CT brain and CT abdomen/pelvis did not shows any obvious source of bleeding.  Given her symptomatic anemia, ongoing dysphagia and altered mental status, patient has a poor outcome.  Communicated with her legal guardian and palliative team, the decision is made to proceed with comfort care.  Patient will be transferred to a hospice facility for comfort measure.   Discharge Vitals:   BP (!) 148/68 (BP Location: Left Arm)   Pulse 84   Temp (!) 97.5 F (36.4 C)   Resp 17   Ht 5\' 6"  (1.676 m)   Wt 44.4 kg   SpO2 97%   BMI 15.80 kg/m   Pertinent Labs, Studies, and Procedures:  CBC Latest Ref Rng & Units 09/19/2020 09/19/2020 09/19/2020  WBC 4.0 - 10.5 K/uL - 5.5 3.8(L)  Hemoglobin 12.0 - 15.0 g/dL 7.0(L) 6.4(LL) 7.6(L)  Hematocrit 36 - 46 % 24.9(L) 22.6(L) 27.0(L)  Platelets 150 - 400 K/uL - 124(L) 139(L)   CMP Latest Ref Rng & Units 09/19/2020 09/16/2020 09/15/2020  Glucose 70 - 99 mg/dL 199(H) 108(H) 84  BUN 8 - 23 mg/dL 47(H) 47(H) 46(H)  Creatinine 0.44 - 1.00 mg/dL 2.23(H) 2.45(H) 2.58(H)  Sodium 135 - 145 mmol/L 139 145 147(H)  Potassium 3.5 - 5.1  mmol/L 4.1 4.1 4.4  Chloride 98 - 111 mmol/L 101 109 111  CO2 22 - 32 mmol/L 27 26 25   Calcium 8.9 - 10.3 mg/dL 8.4(L) 8.4(L) 8.3(L)  Total Protein 6.5 - 8.1 g/dL - 5.9(L) 5.5(L)  Total Bilirubin 0.3 - 1.2 mg/dL - 0.8 0.4  Alkaline Phos 38 - 126 U/L - 216(H) 207(H)  AST 15 - 41 U/L - 21 21  ALT 0 - 44 U/L - 18 20   CT CHEST WO CONTRAST  Result Date: 09/15/2020 CLINICAL DATA:  Chest pain or shortness of breath with pleurisy for effusion suspected EXAM: CT CHEST WITHOUT CONTRAST TECHNIQUE: Multidetector CT imaging of the chest was performed following the standard protocol without IV contrast. COMPARISON:  05/10/2020 FINDINGS: Cardiovascular: Cardiomegaly with pericardial effusion greatest along the inferior heart where thickness is 18 mm. Aortic and coronary atherosclerosis. Persistent left SVC. Mediastinum/Nodes: Negative for adenopathy or mass. Lungs/Pleura: Moderate right and small left  pleural effusion. Dependent atelectasis. Airway thickening which may be congestive cuffing. Incidental tracheal diverticulum at the thoracic inlet. Upper Abdomen: No acute finding Musculoskeletal: Marked anasarca. There is asymmetric edema in the right breast without visible operative change or mass at the right axilla. Spondylosis. No acute osseous finding. IMPRESSION: Volume overload: *Right more than left pleural effusion, moderate on the right. *Pericardial effusion measuring nearly 2 cm in thickness along the inferior heart. *Anasarca with asymmetric severe right-sided chest wall swelling. There is a persistent left SVC, but no right brachiocephalic vein compression. No visible axillary mass. Electronically Signed   By: Monte Fantasia M.D.   On: 09/15/2020 04:16   DG Chest Portable 1 View  Result Date: 09/15/2020 CLINICAL DATA:  Right-sided facial swelling and right-sided breast swelling. EXAM: PORTABLE CHEST 1 VIEW COMPARISON:  June 14, 2020 FINDINGS: Mild atelectasis and/or infiltrate is seen within the  right lung base. This is decreased in severity when compared to the prior study. A more focal opacity is seen overlying the lateral aspect of the right lung base. There is no evidence of a pneumothorax. Moderate to marked severity enlargement of the cardiac silhouette is seen. The visualized skeletal structures are unremarkable. IMPRESSION: 1. Cardiomegaly with mild right basilar atelectasis and/or infiltrate. 2. Focal opacity overlying the lateral aspect of the right lung base which may represent a small amount of focal infiltrate. The presence of a loculated right-sided pleural effusion or pleural mass cannot be excluded. Further evaluation with chest CT is recommended. Electronically Signed   By: Virgina Norfolk M.D.   On: 09/15/2020 02:31   ECHOCARDIOGRAM COMPLETE  Result Date: 09/15/2020    ECHOCARDIOGRAM REPORT   Patient Name:   AMARIE VILES Date of Exam: 09/15/2020 Medical Rec #:  [id number]      Height:       66.0 in Accession #:    [id number]     Weight:       96.8 lb Date of Birth:  08-19-1934      BSA:          1.469 m Patient Age:    24 years       BP:           197/124 mmHg Patient Gender: F              HR:           74 bpm. Exam Location:  Inpatient Procedure: 2D Echo, Cardiac Doppler and Color Doppler  Reported to: Dr Tish Men on 09/15/2020 1:27:00 PM. Indications:    Dyspnea 786.09 / /R06.00  History:        Patient has no prior history of Echocardiogram examinations.                 CHF; Risk Factors:Diabetes. CKD.  Sonographer:    Jonelle Sidle Dance Referring Phys: [id number] Elsa  1. The right pulmonary artery has what appears to be thrombus in it. It is rather large clot burden. CT PE study or V/Q recommended.  2. The coronary sinus is dilated concerning for persistent L SVC. This was seen on CT chest.  3. Left ventricular ejection fraction, by estimation, is 50 to 55%. The left ventricle has low normal function. The left ventricle has no regional wall motion abnormalities.  There is moderate concentric left ventricular hypertrophy. Left ventricular diastolic function could not be evaluated. There is the interventricular septum is flattened in systole and diastole, consistent with right ventricular pressure and volume overload.  4. Right ventricular systolic function is mildly reduced. The right ventricular size is moderately enlarged. There is severely elevated pulmonary artery systolic pressure. The estimated right ventricular systolic pressure is 37.9 mmHg.  5. Left atrial size was mildly dilated.  6. Moderate pericardial effusion. The pericardial effusion is posterior to the left ventricle and lateral to the left ventricle. There is no evidence of cardiac tamponade.  7. The mitral valve is degenerative. Moderate mitral valve regurgitation. No evidence of mitral stenosis. Moderate to severe mitral annular calcification.  8. The aortic valve is tricuspid. Aortic valve regurgitation is not visualized. No aortic stenosis is present.  9. The inferior vena cava is normal in size with <50% respiratory variability, suggesting right atrial pressure of 8 mmHg. Comparison(s): No prior Echocardiogram. Conclusion(s)/Recommendation(s): Findings consistent with Cor Pulmonale. FINDINGS  Left Ventricle: Left ventricular ejection fraction, by estimation, is 50 to 55%. The left ventricle has low normal function. The left ventricle has no regional wall motion abnormalities. The left ventricular internal cavity size was normal in size. There is moderate concentric left ventricular hypertrophy. The interventricular septum is flattened in systole and diastole, consistent with right ventricular pressure and volume overload. Left ventricular diastolic function could not be evaluated due to  mitral annular calcification (moderate or greater). Left ventricular diastolic function could not be evaluated. Right Ventricle: The right ventricular size is moderately enlarged. No increase in right ventricular wall  thickness. Right ventricular systolic function is mildly reduced. There is severely elevated pulmonary artery systolic pressure. The tricuspid regurgitant velocity is 3.61 m/s, and with an assumed right atrial pressure of 8 mmHg, the estimated right ventricular systolic pressure is 02.4 mmHg. Left Atrium: Left atrial size was mildly dilated. Right Atrium: Right atrial size was normal in size. Pericardium: A moderately sized pericardial effusion is present. The pericardial effusion is posterior to the left ventricle and lateral to the left ventricle. The pericardial effusion appears to contain fibrous material. There is no evidence of cardiac tamponade. Mitral Valve: The mitral valve is degenerative in appearance. There is moderate calcification of the anterior mitral valve leaflet(s). Moderate to severe mitral annular calcification. Moderate mitral valve regurgitation. No evidence of mitral valve stenosis. Tricuspid Valve: The tricuspid valve is grossly normal. Tricuspid valve regurgitation is mild . No evidence of tricuspid stenosis. Aortic Valve: The aortic valve is tricuspid. Aortic valve regurgitation is not visualized. No aortic stenosis is present. Pulmonic Valve: The pulmonic valve was grossly normal. Pulmonic valve regurgitation is mild. No evidence of pulmonic stenosis. Aorta: The aortic root and ascending aorta are structurally normal, with no evidence of dilitation. Venous: The right upper pulmonary vein is normal. The inferior vena cava is normal in size with less than 50% respiratory variability, suggesting right atrial pressure of 8 mmHg. IAS/Shunts: There is left bowing of the interatrial septum, suggestive of elevated right atrial pressure. The atrial septum is grossly normal.  LEFT VENTRICLE PLAX 2D LVIDd:         4.20 cm  Diastology LVIDs:         2.80 cm  LV e' medial:    2.50 cm/s LV PW:         1.40 cm  LV E/e' medial:  47.6 LV IVS:        1.20 cm  LV e' lateral:   3.70 cm/s LVOT diam:     1.80  cm  LV E/e' lateral: 32.2 LV SV:         56 LV SV Index:  38 LVOT Area:     2.54 cm  RIGHT VENTRICLE            IVC RV Basal diam:  2.60 cm    IVC diam: 1.80 cm RV S prime:     5.98 cm/s TAPSE (M-mode): 1.4 cm LEFT ATRIUM             Index       RIGHT ATRIUM           Index LA diam:        3.70 cm 2.52 cm/m  RA Area:     12.90 cm LA Vol (A2C):   61.7 ml 42.00 ml/m RA Volume:   30.60 ml  20.83 ml/m LA Vol (A4C):   38.4 ml 26.14 ml/m LA Biplane Vol: 51.8 ml 35.26 ml/m  AORTIC VALVE LVOT Vmax:   112.00 cm/s LVOT Vmean:  64.000 cm/s LVOT VTI:    0.222 m  AORTA Ao Root diam: 2.70 cm Ao Asc diam:  2.60 cm MITRAL VALVE                TRICUSPID VALVE MV Area (PHT): 4.68 cm     TR Peak grad:   52.1 mmHg MV Decel Time: 162 msec     TR Vmax:        361.00 cm/s MV E velocity: 119.00 cm/s MV A velocity: 135.00 cm/s  SHUNTS MV E/A ratio:  0.88         Systemic VTI:  0.22 m                             Systemic Diam: 1.80 cm Eleonore Chiquito MD Electronically signed by Eleonore Chiquito MD Signature Date/Time: 09/15/2020/1:57:42 PM    Final    VAS [place] LOWER EXTREMITY VENOUS (DVT)  Result Date: 09/16/2020  Lower Venous DVT Study Indications: Anasarca.  Limitations: Altered mental status, patient in fetal position and non cooperative. Comparison Study: No prior study Performing Technologist: Sharion Dove RVS  Examination Guidelines: A complete evaluation includes B-mode imaging, spectral Doppler, color Doppler, and power Doppler as needed of all accessible portions of each vessel. Bilateral testing is considered an integral part of a complete examination. Limited examinations for reoccurring indications may be performed as noted. The reflux portion of the exam is performed with the patient in reverse Trendelenburg.  +---------+---------------+---------+-----------+----------+-------------------+ RIGHT    CompressibilityPhasicitySpontaneityPropertiesThrombus Aging       +---------+---------------+---------+-----------+----------+-------------------+ CFV      Full                                         pulsatile waveforms                                                       noted               +---------+---------------+---------+-----------+----------+-------------------+ SFJ      Full                                                             +---------+---------------+---------+-----------+----------+-------------------+  FV Prox  Full                                                             +---------+---------------+---------+-----------+----------+-------------------+ FV Mid   Full                                                             +---------+---------------+---------+-----------+----------+-------------------+ FV DistalFull                                                             +---------+---------------+---------+-----------+----------+-------------------+ PFV      Full                                                             +---------+---------------+---------+-----------+----------+-------------------+ POP      Full                                         pulsatile waveforms                                                       noted               +---------+---------------+---------+-----------+----------+-------------------+ PTV      Full                                                             +---------+---------------+---------+-----------+----------+-------------------+ PERO     Full                                                             +---------+---------------+---------+-----------+----------+-------------------+   +---------+---------------+---------+-----------+----------+-------------------+ LEFT     CompressibilityPhasicitySpontaneityPropertiesThrombus Aging      +---------+---------------+---------+-----------+----------+-------------------+  CFV      Full                                         pulsatile waveforms  noted               +---------+---------------+---------+-----------+----------+-------------------+ SFJ      Full                                                             +---------+---------------+---------+-----------+----------+-------------------+ FV Prox                                               Not visualized      +---------+---------------+---------+-----------+----------+-------------------+ FV Mid                                                Not visualized      +---------+---------------+---------+-----------+----------+-------------------+ FV Distal                                             Not visualized      +---------+---------------+---------+-----------+----------+-------------------+ PFV                                                   Not visualized      +---------+---------------+---------+-----------+----------+-------------------+ POP      Full                                         pulsatile waveforms                                                       noted               +---------+---------------+---------+-----------+----------+-------------------+ PTV      Full                                                             +---------+---------------+---------+-----------+----------+-------------------+ PERO     Full                                                             +---------+---------------+---------+-----------+----------+-------------------+    Summary: RIGHT: - There is no evidence of deep vein thrombosis in the lower extremity. However, portions of this examination were limited- see technologist comments above.  pulsatile waveforms noted, interstitial edema noted  throughout  LEFT: - There is no evidence of deep vein thrombosis in the lower extremity. However,  portions of this examination were limited- see technologist comments above.  Pulsatile waveforms noted, interstitial fluid noted throughout.  *See table(s) above for measurements and observations. Electronically signed by Deitra Mayo MD on 09/16/2020 at 1:50:42 PM.    Final    US Abdomen Limited RUQ (LIVER/GB)  Result Date: 09/15/2020 CLINICAL DATA:  Anasarca EXAM: ULTRASOUND ABDOMEN LIMITED RIGHT UPPER QUADRANT COMPARISON:  CT abdomen pelvis 05/03/2020 FINDINGS: Gallbladder: Prior CT demonstrated layering dense material in the gallbladder suggestive of small stones. Ultrasound is not confirmed gallstones. No gallbladder wall thickening. Negative sonographic Murphy sign Common bile duct: Diameter: 6.9 mm, upper normal. Liver: No focal lesion identified. Within normal limits in parenchymal echogenicity. Portal vein is patent on color Doppler imaging with normal direction of blood flow towards the liver. Other: No ascites.  Right pleural effusion. Mild right renal hydronephrosis with echogenic right renal cortex. IMPRESSION: Prior CT suggested small layering gallstones however these are not seen on the current ultrasound. Gallstones may have passed in the interval. Common bile duct 6.9 mm, upper normal Right pleural effusion.  Mild right hydronephrosis. Electronically Signed   By: Franchot Gallo M.D.   On: 09/15/2020 10:41   Discharge Instructions: Discharge Instructions    Diet - low sodium heart healthy   Complete by: As directed    Discharge instructions   Complete by: As directed    Proceed with comfort measure per palliative team   Increase activity slowly   Complete by: As directed       Signed: Gaylan Gerold, DO 09/20/2020, 11:50 AM   Pager: [phone]

## 2020-09-19 NOTE — Progress Notes (Signed)
[facility]  Pt is active with [facility]. I have reached out to [facility] to speak with Raquel Sarna the pt's CM to discuss them paying for Hospice services at the [facility]. NP with [facility]- Gregary Signs has made referral for pt to go to the [facility]. We do have a contract with [facility].   I have spoke to the pt's legal guardian as well [name]. She is lives in [place] [facility] and is in agreement with pt going to the [facility] if approved. She verbalizes understanding comfort care and wanting to seek this care for her sister to make sure she is comfortable.   I have updated Cedric Fishman as well with SW at [facility] hospital.  Webb Silversmith RN [phone]

## 2020-09-19 NOTE — Progress Notes (Signed)
Daily Progress Note   Patient Name: Tiffany Bridges       Date: 09/19/2020 DOB: June 17, 1934  Age: 86 y.o. MRN#: [id number] Attending Physician: Aldine Contes, MD Primary Care Physician: Willene Hatchet, NP Admit Date: 09/15/2020  Reason for Follow-up:   Subjective: Notified by medical staff that patient has continued to decline overnight. She had another aspiration event yesterday evening. Per SLP assessment today, "patient demonstrates significantly worsened swallowing ability since Saturday". Hemoglobin has dropped to 6.4 today, and transfusion has been ordered.  I was able to reach sister (Tiffany Bridges) by phone. I reviewed the patient's current medical condition in detail, focusing on decline over the past 24 hours despite aggressive medical interventions.   I again review the concept of a comfort path, recommending that we stop aggressive medical interventions and focus on Tiffany Bridges's comfort and dignity. Tiffany Bridges agrees with this plan of care. Emotional support provided.   Discussed the details of transitioning to comfort care while in the hospital, and what that means--keeping her clean and dry, no labs, no artificial hydration or feeding, no antibiotics, minimizing of medications, comfort feeds, medication for pain and dyspnea.   Also reviewed hospice philosophy and discussed the option to transfer patient to residential hospice. Tiffany Bridges agrees and requests hospice facility in [facility].    Length of Stay: 4  Current Medications: Scheduled Meds:  . sodium chloride   Intravenous Once  . mouth rinse  15 mL Mouth Rinse BID      PRN Meds: acetaminophen **OR** acetaminophen, albuterol, antiseptic oral rinse, fentaNYL (SUBLIMAZE) injection, glycopyrrolate **OR** glycopyrrolate **OR**  glycopyrrolate, haloperidol **OR** haloperidol **OR** haloperidol lactate, LORazepam, polyvinyl alcohol  Physical Exam Vitals reviewed.  Constitutional:      General: She is not in acute distress.    Appearance: She is ill-appearing.  Cardiovascular:     Rate and Rhythm: Normal rate and regular rhythm.  Pulmonary:     Effort: Pulmonary effort is normal.  Neurological:     Comments: Minimally responsive             Vital Signs: BP (!) 114/50 (BP Location: Left Arm)   Pulse 98   Temp 98.1 F (36.7 C) (Axillary)   Resp 14   Ht 5\' 6"  (1.676 m)   Wt 44.4 kg   SpO2 99%   BMI  15.80 kg/m  SpO2: SpO2: 99 % O2 Device: O2 Device: Nasal Cannula O2 Flow Rate: O2 Flow Rate (L/min): 4 L/min  Intake/output summary:   Intake/Output Summary (Last 24 hours) at 09/19/2020 1238 Last data filed at 09/19/2020 0427 Gross per 24 hour  Intake 390 ml  Output 475 ml  Net -85 ml   LBM: Last BM Date: 09/17/20 Baseline Weight: Weight: 47.2 kg Most recent weight: Weight: 44.4 kg       Palliative Assessment/Data: PPS 10%     Palliative Care Assessment & Plan   Patient Profile: 86 y.o.femalewith past medical history of intellectual disability, diabetes mellitus type 2, CKD, CHF, iron deficiency anemia, and dysphagia presented to the ED on 09/15/20 from [place] for right sided facial and breast swelling.  ED course:Patient remained asymptomatic on arrival. Her vitals were stable. However she had mild fascial swelling (periorbital edema and swelling of right cheek) on exam as well as rt breast swelling and bilateral feet swelling. Significant lab results: Sodium 147, alkaline phosphatase 207, WBC 2.5, hemoglobin 8.9, BNP more than 4500, high-sensitivity troponin 55. CT chest without contrast showed anasarca, volume overload, right more than left pleural effusion. Also pericardial effusion (nearly 2 cm along the inferior heart), asymmetric severe right side chest wall swelling persistent  left SVC.She is being admitted for anasarca due to cor pulmonale suspected due to pulmonary embolismand work up for suspicious finding on prior bladder ultrasound.  Of note, patient has been seen by PMT in June of this year. She is also being followed by [facility] [place].  Assessment: Heart failure with preserved EF Iron deficiency anemia CKD stage 4 Diabetes Intellectual disability Hypernatremia Anasarca Pulmonary embolism Underweight dysphagia  Recommendations/Plan:  Full comfort measures initiated  DNR/DNI as previously documented  Transfer to hospice facility in [facility] when bed becomes available - TOC consult placed; hospice liaison notifed  Added orders for symptom management at EOL as well as discontinued orders that were not focused on comfort  Provide frequent assessments and administer PRN medications as clinically necessary to ensure EOL comfort  PMT will continue to follow holistically   Symptom Management:   Fentanyl IV prn for pain or shortness of breath  Lorazepam (ATIVAN) prn for anxiety  Haloperidol (HALDOL) for agitation  Glycopyrrolate (ROBINUL) for excessive secretions  Ondansetron (ZOFRAN) prn for nausea   Goals of Care and Additional Recommendations:  Limitations on Scope of Treatment: Full Comfort Care, Minimize Medications and No Lab Draws   Code Status: DNR/DNI   Prognosis:   < 2 weeks  Discharge Planning:  Hospice facility  Care plan was discussed with Dr. Darrick Meigs, primary RN, and hospice liaison  Thank you for allowing the Palliative Medicine Team to assist in the care of this patient.   Total Time 35 minutes Prolonged Time Billed  no       Greater than 50%  of this time was spent counseling and coordinating care related to the above assessment and plan.  Lavena Bullion, NP  Please contact Palliative Medicine Team phone at [phone] for questions and concerns.

## 2020-09-19 NOTE — Progress Notes (Signed)
Subjective:   Hospital day: 4  Overnight event: Provider was paged at 2 PM yesterday for dropping an oxygen status to the 80s.  Patient just finished eating lunch and started coughing.  Her oxygen status improved to 91% when put on 2L.  Attempt using oral suction but unsuccessful due to patient noncooperation.  Obtain chest x-ray shows mild basal atelectasis with minimal bilateral effusion.  Patient has moderate dysphagia per speech eval and was recommended nectar thick diet.  Dinner was given to the patient with supervision, no coughing during meal per RN.  However she started coughing with decreased oxygen saturations after meal.  Her oxygen was increased to 4L.  Repeat chest x-ray shows new retrocardiac opacity in the left lower lobe concerning for aspiration for atelectasis.  Given this ongoing aspiration with commended nectar thick diet, will repeat speech eval today.  Patient is made n.p.o.  Consider PEG tube if patient continues to aspirate.  Palliative care has talked to her legal guardian and she is not ready to make any decision at this point.  Continue full scope of treatment.  Patient is seen at bedside this morning.  She is somnolent and minimally responsive to verbal stimulation.  Sternal rubs does wake the patient up but she went back to sleeping.  I spoke to her legal guardian, Tiffany Bridges, also patient's sister.  I updated her with her current medical conditions and treatment plan including blood transfusion and CT imaging of brain, abdomen and pelvis.  I explained to her the overall poor prognosis and having aggressive treatment may not be patient's best interest.  Her sister verbalizes understanding and states that she would not like the patient to suffer.  She would like her sister to live as long as she is not suffering.  Tiffany Bridges states that patient is mentally challenged and can be very stubborn.  Patient's daughter also just passed recently due to intracranial bleed and Tiffany Bridges would not  want the patient to suffer like her daughter.  I reintroduced the concept of palliative care and explained to her that palliative team mainly focuses on symptom control and comfort.  Patient states that she would like to talk to the palliative team one more time to help making this decision.  Objective:  Vital signs in last 24 hours: Vitals:   09/18/20 2058 09/19/20 0033 09/19/20 0400 09/19/20 0934  BP: (!) 142/81 116/86 (!) 115/57 (!) 114/50  Pulse: 94 100 98   Resp: (!) 21 20 (!) 21 14  Temp: 97.6 F (36.4 C) 97.9 F (36.6 C) 98.1 F (36.7 C) 98.1 F (36.7 C)  TempSrc: Oral Oral Oral Axillary  SpO2: 92% 98% 99%   Weight:   44.4 kg   Height:       CBC Latest Ref Rng & Units 09/19/2020 09/19/2020 09/19/2020  WBC 4.0 - 10.5 K/uL - 5.5 3.8(L)  Hemoglobin 12.0 - 15.0 g/dL 7.0(L) 6.4(LL) 7.6(L)  Hematocrit 36 - 46 % 24.9(L) 22.6(L) 27.0(L)  Platelets 150 - 400 K/uL - 124(L) 139(L)   CMP Latest Ref Rng & Units 09/19/2020 09/16/2020 09/15/2020  Glucose 70 - 99 mg/dL 199(H) 108(H) 84  BUN 8 - 23 mg/dL 47(H) 47(H) 46(H)  Creatinine 0.44 - 1.00 mg/dL 2.23(H) 2.45(H) 2.58(H)  Sodium 135 - 145 mmol/L 139 145 147(H)  Potassium 3.5 - 5.1 mmol/L 4.1 4.1 4.4  Chloride 98 - 111 mmol/L 101 109 111  CO2 22 - 32 mmol/L 27 26 25   Calcium 8.9 - 10.3 mg/dL 8.4(L)  8.4(L) 8.3(L)  Total Protein 6.5 - 8.1 g/dL - 5.9(L) 5.5(L)  Total Bilirubin 0.3 - 1.2 mg/dL - 0.8 0.4  Alkaline Phos 38 - 126 U/L - 216(H) 207(H)  AST 15 - 41 U/L - 21 21  ALT 0 - 44 U/L - 18 20     Physical Exam  Physical Exam Constitutional:      Appearance: She is not toxic-appearing.     Comments: Somnolent, minimally responsive to physical and verbal stimulation.  Wake up to sternal rub but went back to sleep.  HENT:     Head: Normocephalic.  Eyes:     Comments: Patient is unable to open her eyes  Cardiovascular:     Rate and Rhythm: Normal rate and regular rhythm.  Pulmonary:     Effort: Pulmonary effort is normal. No  respiratory distress.  Abdominal:     General: Bowel sounds are normal.     Palpations: Abdomen is soft.  Musculoskeletal:     Right lower leg: No edema.     Left lower leg: No edema.  Skin:    General: Skin is warm.     Assessment/Plan: Tiffany Bridges is a 86 y.o. female with from [facility] hx of heart failure preserved ejection fraction, CKD 4, IDA, diabetes type 2 presenting with facial and right breast swelling, found to have cor pulmonale secondary to possible PE.  Principal Problem:   Acute cor pulmonale (HCC) Active Problems:   Symptomatic anemia   Dysphagia   Anasarca   CKD (chronic kidney disease) stage 4, GFR 15-29 ml/min (HCC)   BMI less than 19,adult   DNR (do not resuscitate)   DNI (do not intubate)   Weakness   Anxiety   Failure to thrive in adult   Malnutrition of moderate degree  Comfort measure Patient hemoglobin was 7.6 this morning and repeat hemoglobin was 6.4.  Heparin was held.  She was typed and crossed and plan to transfuse 1 unit.  CT brain and abdomen/pelvis were ordered to determine the source of the bleeding.  Repeat speech eval shows that patient has worsening dysphagia.  Overall, with her ongoing dysphagia, unknown source of bleeding and altered mental status, patient has a poor outcome.  Communicated with patient's legal guardian, Tiffany Bridges, she expressed that she does not want patient to suffer.  The palliative team has spoken to [facility] and she has decided to pursue with comfort measure.  Await hospice bed. -DC antibiotics, warfarin and heparin -Haldol and Ativan as needed. -Glycopyrrolate for secretion -Pending bed placement.   Prior to Admission Living Arrangement: Nursing home at [facility] Anticipated Discharge Location:  Hospice facility Barriers to Discharge:  Bed placement Dispo: Anticipated discharge in approximately 1 day(s).     Gaylan Gerold, DO 09/19/2020, 11:55 AM Pager: [phone] After 5pm on weekdays and 1pm on weekends: On  Call pager [phone]

## 2020-09-19 NOTE — Progress Notes (Signed)
  Speech Language Pathology Treatment: Dysphagia  Patient Details Name: Tiffany Bridges MRN: [id number] DOB: 11-Feb-1934 Today's Date: 09/19/2020 Time: 3491-7915 SLP Time Calculation (min) (ACUTE ONLY): 25 min  Assessment / Plan / Recommendation Clinical Impression  Pt demonstrates significantly worsened swallowing ability since Saturday. She is equally as alert and participatory. Oral function appears the same. However, now she demonstrates immediate wet coughing with all textures of PO. Hyoid excursion cannot be palpated with swallow efforts. She does not allow appropriate suction to clear expectorated residue with weak coughing efforts. Suspect cough is insufficient to protect airway. Despite difficulty, pt continues to request food and drink, is asking for ice cream.   MBS could be completed for more objective assessment, but would it change pts course of care? Pt has intellectual disability and cries for food and drink. She would not be capable of participating in any therapeutic activities or compensatory strategies.  If she were made NPO it would increase her distress but likely would not decrease her risk of aspiration as she is also struggling to manage her secretions. Also given her poor mobility she would be at risk of reflux with enteral feeding. Would suggest initiating comfort feeding, perhaps puree and honey thick liquids to reduce frequency of hard coughing, with known risk given no alternatives beneficial to the pt. Please contact me to discuss plan of care as needed. Testing can be attempted if desired by team.     HPI HPI: 86 y.o. female  with past medical history of intellectual disability, diabetes mellitus type 2, CKD, CHF, iron deficiency anemia, and dysphagia presented to the ED on 09/15/20 from [place] for right sided facial and breast swelling. CT chest without contrast showed anasarca, volume overload, right more than left pleural effusion.  Also pericardial effusion (nearly  2 cm along the inferior heart), asymmetric severe right side chest wall swelling persistent left SVC. She is being admitted for anasarca due to cor pulmonale suspected due to pulmonary embolism and work up for suspicious finding on prior bladder ultrasound. History of dyspahgia includes MBS at [facility] 2019 documented aspiration of thin during MBS in 2018 and rec'd nectar and pt reported not using thickener. MBS 01/06/18 showed mild pharyngeal residue without penetration or aspiration and thin recommended. Pt admitted 05/12/20 for aspiration pna,found to have multiple risk factors for aspiration pna including coughing with thin liquids, poor dental hygiene, decreased mentation and awareness of PO, inability to meet nutritional needs. SLP signed off due to pts inability to particiapte in therapy.       SLP Plan  Continue with current plan of care       Recommendations  Diet recommendations: NPO                Follow up Recommendations: Skilled Nursing facility SLP Visit Diagnosis: Dysphagia, oropharyngeal phase (R13.12) Plan: Continue with current plan of care       GO               Tiffany Baltimore, MA [place] Pager [phone] Office [phone]  Tiffany Bridges 09/19/2020, 9:06 AM

## 2020-09-19 NOTE — NC FL2 (Signed)
[place] MEDICAID FL2 LEVEL OF CARE SCREENING TOOL     IDENTIFICATION  Patient Name: Tiffany Bridges Birthdate: July 24, 1934 Sex: female Admission Date (Current Location): 09/15/2020  [facility] and [place] Number:  Herbalist and Address:  [facility] [place] [address] [place], [place] [address]      Provider Number: [id number]  Attending Physician Name and Address:  Aldine Contes, MD  Relative Name and Phone Number:  [name], legal guardian/sister    Current Level of Care: Hospital Recommended Level of Care: [place] Prior Approval Number:    Date Approved/Denied:   PASRR Number: [id number]  Discharge Plan: SNF    Current Diagnoses: Patient Active Problem List   Diagnosis Date Noted  . Acute cor pulmonale ([place]) 09/18/2020  . Failure to thrive in adult 09/18/2020  . Malnutrition of moderate degree 09/18/2020  . Anxiety   . Encounter for hospice care discussion   . Anasarca 09/15/2020  . CKD (chronic kidney disease) stage 4, GFR 15-29 ml/min (HCC) 09/15/2020  . BMI less than 19,adult 09/15/2020  . DNR (do not resuscitate)   . DNI (do not intubate)   . Weakness   . Goals of care, counseling/discussion   . Palliative care by specialist   . DNR (do not resuscitate) discussion   . Aspiration pneumonia ([place])   . Hypernatremia   . Acute renal failure (ARF) ([place]) 05/10/2020    Orientation RESPIRATION BLADDER Height & Weight     Self  O2 (Nasal cannula 4L) Continent, External catheter Weight: 97 lb 14.2 oz (44.4 kg) Height:  5\' 6"  (167.6 cm)  BEHAVIORAL SYMPTOMS/MOOD NEUROLOGICAL BOWEL NUTRITION STATUS      Continent Diet (Please see DC Summary)  AMBULATORY STATUS COMMUNICATION OF NEEDS Skin   Extensive Assist Verbally Normal                       Personal Care Assistance Level of Assistance  Bathing, Feeding, Dressing Bathing Assistance: Maximum assistance Feeding assistance: Limited  assistance Dressing Assistance: Limited assistance     Functional Limitations Info  Sight, Hearing, Speech Sight Info: Adequate Hearing Info: Adequate Speech Info: Adequate    SPECIAL CARE FACTORS FREQUENCY  PT (By licensed PT), OT (By licensed OT)     PT Frequency: 5x/week OT Frequency: 3x/week            Contractures Contractures Info: Not present    Additional Factors Info  Code Status, Allergies, Insulin Sliding Scale Code Status Info: DNR Allergies Info: NKA   Insulin Sliding Scale Info: See dc summary       Current Medications (09/19/2020):  This is the current hospital active medication list Current Facility-Administered Medications  Medication Dose Route Frequency Provider Last Rate Last Admin  . acetaminophen (TYLENOL) tablet 650 mg  650 mg Oral Q6H PRN Masoudi, Elhamalsadat, MD       Or  . acetaminophen (TYLENOL) suppository 650 mg  650 mg Rectal Q6H PRN Masoudi, Elhamalsadat, MD      . albuterol (PROVENTIL) (2.5 MG/3ML) 0.083% nebulizer solution 2.5 mg  2.5 mg Nebulization Q2H PRN Masoudi, Elhamalsadat, MD      . amLODipine (NORVASC) tablet 10 mg  10 mg Oral Daily Gaylan Gerold, DO   10 mg at 09/18/20 1007  . dextrose 50 % solution 50 mL  1 ampule Intravenous PRN Andrew Au, MD      . feeding supplement (NEPRO CARB STEADY) liquid 237 mL  237  mL Oral BID BM Lucious Groves, DO   237 mL at 09/18/20 1009  . heparin ADULT infusion 100 units/mL (25000 units/211mL sodium chloride 0.45%)  600 Units/hr Intravenous Continuous Rebbeca Paul B, RPH 6 mL/hr at 09/18/20 1900 600 Units/hr at 09/18/20 1900  . insulin aspart (novoLOG) injection 0-9 Units  0-9 Units Subcutaneous TID WC Gaylan Gerold, DO   2 Units at 09/19/20 [id number]  . lactated ringers infusion   Intravenous Continuous Gaylan Gerold, DO      . LORazepam (ATIVAN) injection 1 mg  1 mg Intravenous Q4H PRN Lin Landsman, NP   1 mg at 09/19/20 0937  . MEDLINE mouth rinse  15 mL Mouth Rinse BID Lucious Groves, DO    15 mL at 09/18/20 2133  . multivitamin with minerals tablet 1 tablet  1 tablet Oral Daily Lucious Groves, DO   1 tablet at 09/18/20 1007  . pantoprazole (PROTONIX) injection 40 mg  40 mg Intravenous BID Mitzi Hansen, MD   40 mg at 09/19/20 0937  . polyethylene glycol (MIRALAX / GLYCOLAX) packet 17 g  17 g Oral Daily PRN Masoudi, Dorthula Rue, MD      . Resource ThickenUp Clear   Oral PRN Lucious Groves, DO      . Warfarin - Pharmacist Dosing Inpatient   Does not apply q1600 Lyndee Leo [facility]   Given at 09/18/20 1709     Discharge Medications: Please see discharge summary for a list of discharge medications.  Relevant Imaging Results:  Relevant Lab Results:   Additional Information [phone]. Patient is PACE participant  Benard Halsted, LCSW

## 2020-09-20 MED ORDER — POLYVINYL ALCOHOL 1.4 % OP SOLN: 1.0000 [drp] | Freq: Four times a day (QID) | OPHTHALMIC | 0 refills | Status: AC | PRN

## 2020-09-20 MED ORDER — GLYCOPYRROLATE 0.2 MG/ML IJ SOLN: 0.2000 mg | INTRAMUSCULAR | Status: AC | PRN

## 2020-09-20 MED ORDER — ALBUTEROL SULFATE (2.5 MG/3ML) 0.083% IN NEBU: 2.5000 mg | INHALATION_SOLUTION | RESPIRATORY_TRACT | 12 refills | Status: AC | PRN

## 2020-09-20 MED ORDER — HALOPERIDOL 0.5 MG PO TABS: 0.5000 mg | ORAL_TABLET | ORAL | Status: AC | PRN

## 2020-09-20 MED ORDER — HALOPERIDOL LACTATE 5 MG/ML IJ SOLN: 0.5000 mg | INTRAMUSCULAR | Status: AC | PRN

## 2020-09-20 MED ORDER — FENTANYL CITRATE (PF) 100 MCG/2ML IJ SOLN
50.0000 ug | INTRAMUSCULAR | 0 refills | Status: AC | PRN
Start: 2020-09-20 — End: ?

## 2020-09-20 MED ORDER — BIOTENE DRY MOUTH MT LIQD: 15.0000 mL | OROMUCOSAL | Status: AC | PRN

## 2020-09-20 MED ORDER — ACETAMINOPHEN 650 MG RE SUPP: 650.0000 mg | Freq: Four times a day (QID) | RECTAL | 0 refills | Status: AC | PRN

## 2020-09-20 MED ORDER — ACETAMINOPHEN 325 MG PO TABS: 650.0000 mg | ORAL_TABLET | Freq: Four times a day (QID) | ORAL | Status: AC | PRN

## 2020-09-20 MED ORDER — HALOPERIDOL LACTATE 2 MG/ML PO CONC: 0.5000 mg | ORAL | 0 refills | Status: AC | PRN

## 2020-09-20 MED ORDER — LORAZEPAM 2 MG/ML IJ SOLN: 1.0000 mg | INTRAMUSCULAR | 0 refills | Status: AC | PRN

## 2020-09-20 MED ORDER — GLYCOPYRROLATE 1 MG PO TABS: 1.0000 mg | ORAL_TABLET | ORAL | Status: AC | PRN

## 2020-09-20 NOTE — Progress Notes (Signed)
   Subjective:   Hospital day: 5  Overnight event: No acute event overnight  Patient is resting comfortably during examination. She gives signs that she is not in pain.  Objective:  Vital signs in last 24 hours: Vitals:   09/19/20 0934 09/19/20 1350 09/19/20 2049 09/19/20 2053  BP: (!) 114/50 (!) 144/53  (!) 148/68  Pulse:  73  84  Resp: 14 20 18 17   Temp: 98.1 F (36.7 C)  97.8 F (36.6 C) (!) 97.5 F (36.4 C)  TempSrc: Axillary  Oral   SpO2:    97%  Weight:      Height:        Physical Exam Physical Exam Constitutional:      General: She is not in acute distress.    Appearance: She is not toxic-appearing.     Comments: Somnolent but awake to verbal stimulation  HENT:     Head: Normocephalic.  Eyes:     General:        Right eye: No discharge.        Left eye: No discharge.  Cardiovascular:     Rate and Rhythm: Normal rate and regular rhythm.  Pulmonary:     Effort: No respiratory distress.     Breath sounds: Normal breath sounds.  Musculoskeletal:        General: No tenderness.     Right lower leg: No edema.     Left lower leg: No edema.  Skin:    General: Skin is warm.     Assessment/Plan: Tiffany Hinsonis a 86 y.o.femalewithfrom PACEhx of heart failure preserved ejection fraction, CKD 4, IDA, diabetes type 2presenting with facial and right breast swelling, found to have cor pulmonale secondary to possible PE. Patient is made comfort.   Principal Problem:   Acute cor pulmonale (HCC) Active Problems:   Symptomatic anemia   Dysphagia   Anasarca   CKD (chronic kidney disease) stage 4, GFR 15-29 ml/min (HCC)   BMI less than 19,adult   DNR (do not resuscitate)   DNI (do not intubate)   Weakness   Anxiety   Failure to thrive in adult   Malnutrition of moderate degree  Comfort measure Patient is resting comfortably with no obvious signs of suffering. Referral to hospice of the [facility] branch. Awaiting approval. Continue comfort  measure per palliative care. -DC antibiotics, warfarin and heparin -Haldol and Ativan as needed. -Glycopyrrolate for secretion -Pending bed placement at hospice in [facility].  Prior to Admission Living Arrangement:Nursing home at [place] Anticipated Discharge Location: Hospice facility Barriers to Discharge: Bed placement Dispo: Anticipated discharge in approximately1-2day(s).   Gaylan Gerold, DO 09/20/2020, 6:43 AM Pager: [phone] After 5pm on weekdays and 1pm on weekends: On Call pager [phone]

## 2020-09-20 NOTE — Care Management Important Message (Signed)
Important Message  Patient Details  Name: Tiffany Bridges MRN: [id number] Date of Birth: 1934-04-07   Medicare Important Message Given:  Yes     Shelda Altes 09/20/2020, 9:10 AM

## 2020-09-20 NOTE — TOC Transition Note (Addendum)
Transition of Care [facility]) - CM/SW Discharge Note   Patient Details  Name: Tiffany Bridges MRN: [id number] Date of Birth: 1934/01/05  [facility] CM/SW Contact:  Trula Ore, [place] Phone Number: 09/20/2020, 12:19 PM   Clinical Narrative:     Patient will DC to: [place]  Anticipated DC date: 09/20/2020  Family notified: [name]  Transport by: Corey Harold   ?  Per MD patient ready for DC to [place] . RN, patient, patient's family,[facility], and Cheri with [facility] notified of DC. RN given number for report tele# [phone]. DC packet on chart. DNR signed by MD on chart.Ambulance transport requested for patient.  CSW signing off.  Final next level of care: [place] ([place]) Barriers to Discharge: No Barriers Identified   Patient Goals and CMS Choice     Choice offered to / list presented to : NA ([name] Legal Guardian)  Discharge Placement              Patient chooses bed at:  [facility] [place]) Patient to be transferred to facility by: [place] Name of family member notified: [name] Patient and family notified of of transfer: 09/20/20  Discharge Plan and Services                                     Social Determinants of Health (SDOH) Interventions     Readmission Risk Interventions No flowsheet data found.

## 2020-09-22 LAB — TYPE AND SCREEN
ABO/RH(D): A POS
Antibody Screen: NEGATIVE
Unit division: 0

## 2020-09-22 LAB — BPAM RBC
Blood Product Expiration Date: 202111182359
Unit Type and Rh: 6200

## 2020-10-18 DEATH — deceased

## 2022-08-07 IMAGING — CT CT HEAD W/O CM
5 of 8 series · 17 of 47 positions shown, 18 images · non-contrast
Comparison: 07/17/2015

CLINICAL DATA: Delirium

EXAM:
CT HEAD WITHOUT CONTRAST
TECHNIQUE: Contiguous axial images were obtained from the base of the skull
through the vertex without intravenous contrast.

[Series 3: head wo · axial · 0.38mm/px · z∈[+176,+226]mm · 2 of 31 slices shown, 3 images (1 of 2)]
[im 11/31  brain]
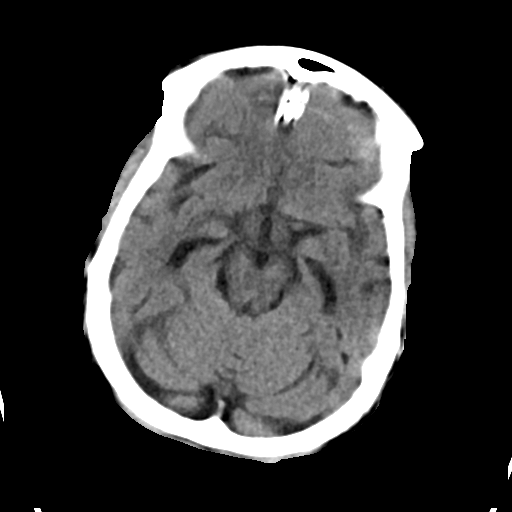
[im 11/31  bone]
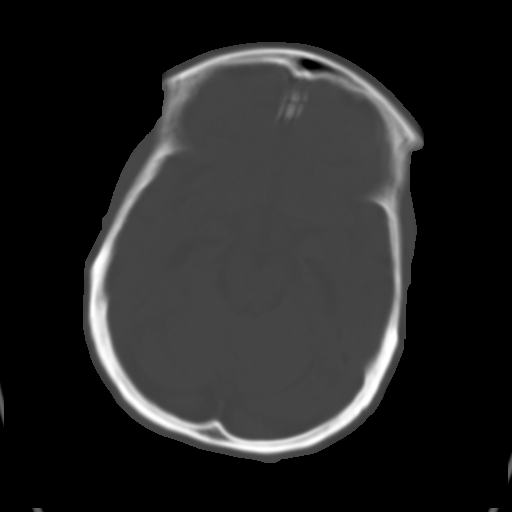
[im 21/31  brain]
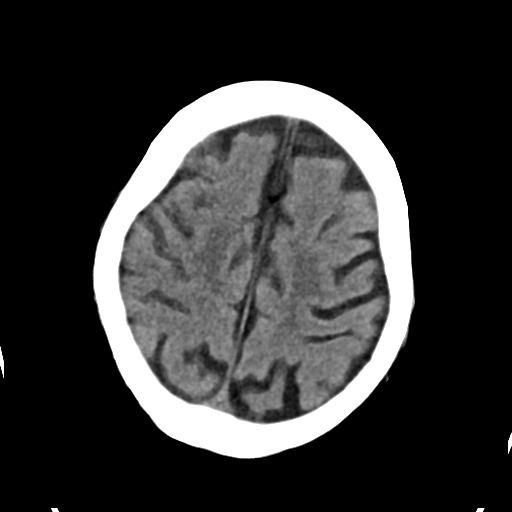

[Series 4: head bone · axial · 0.38mm/px · z∈[+140,+260]mm · 8 of 76 slices shown]
[im 8/76  bone]
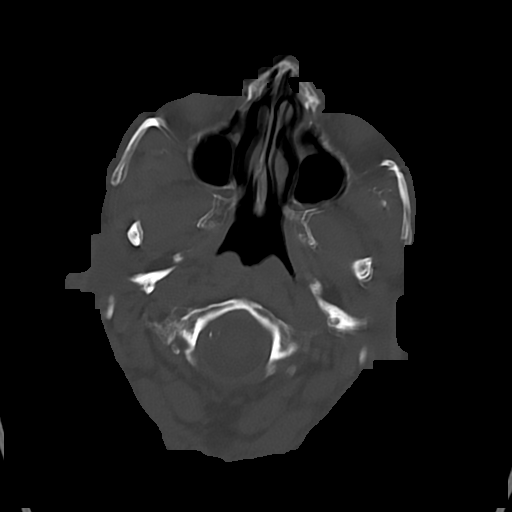
[im 16/76  bone]
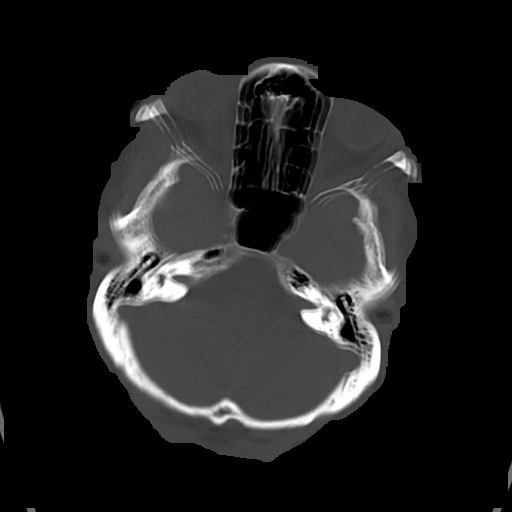
[im 23/76  bone]
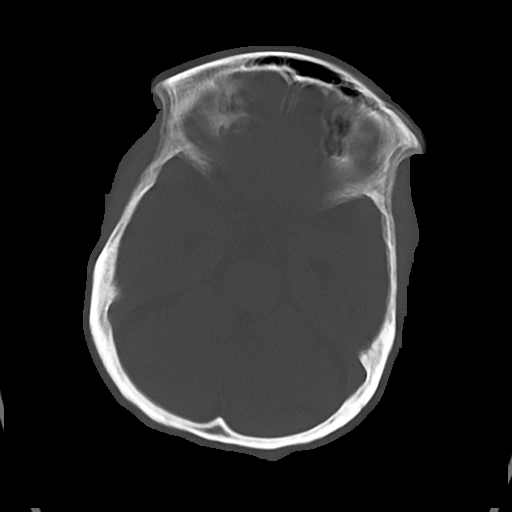
[im 31/76  bone]
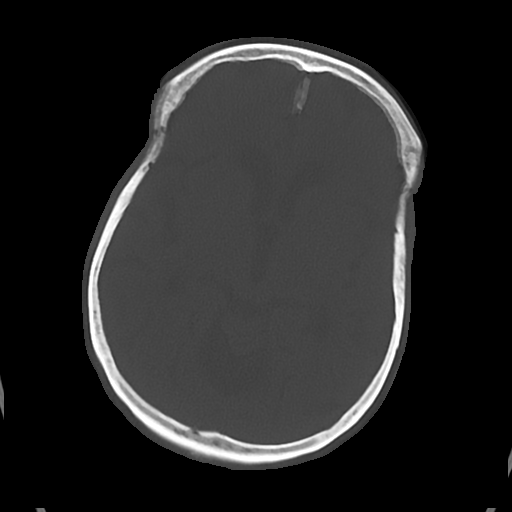
[im 46/76  bone]
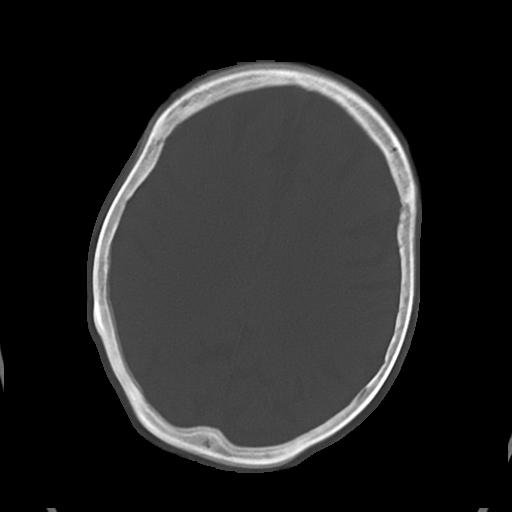
[im 53/76  bone]
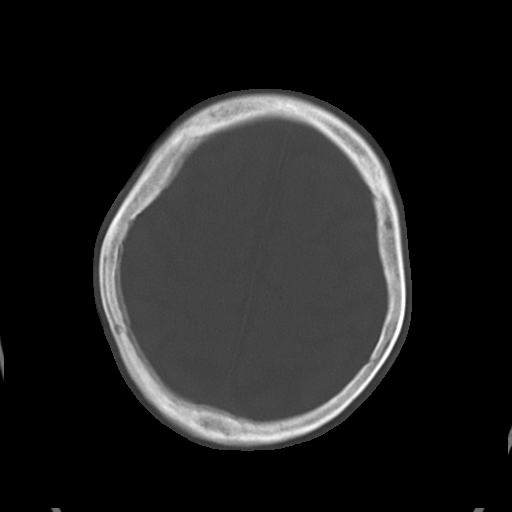
[im 61/76  bone]
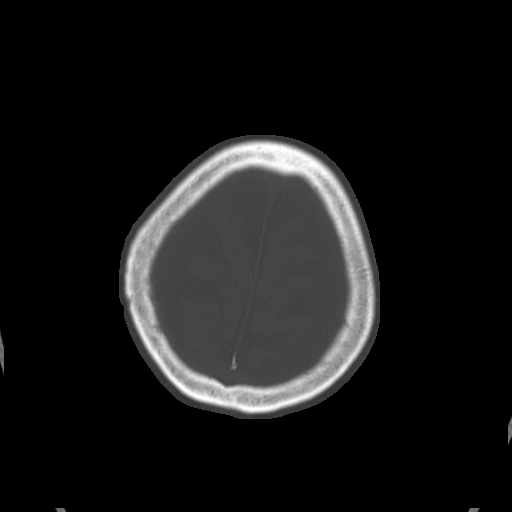
[im 68/76  bone]
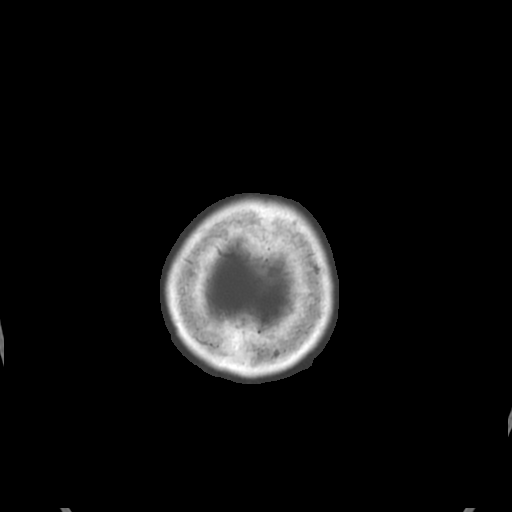

[Series 5: cor soft · coronal · 0.29mm/px · 3 of 60 slices shown]
[im 15/60  brain]
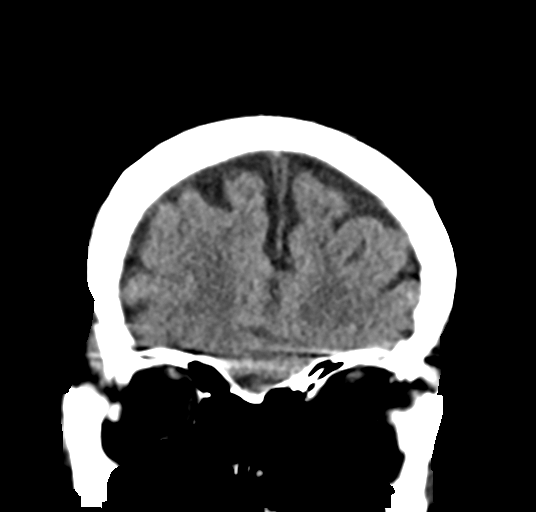
[im 30/60  brain]
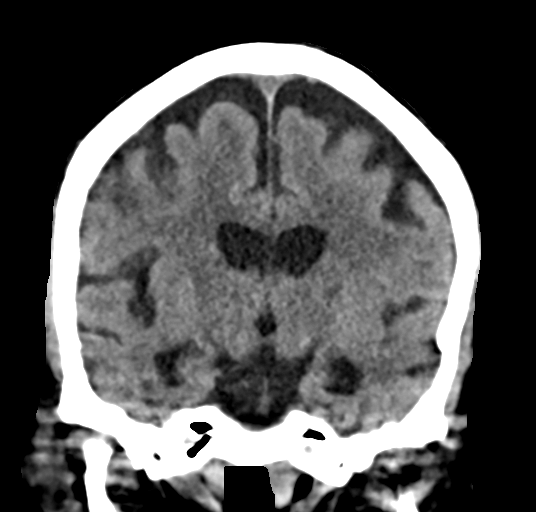
[im 45/60  brain]
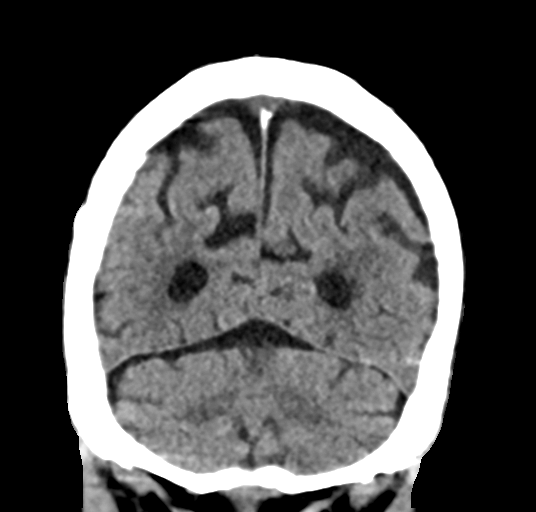

[Series 6: sag soft · sagittal · 0.29mm/px · 2 of 51 slices shown]
[im 17/51  brain]
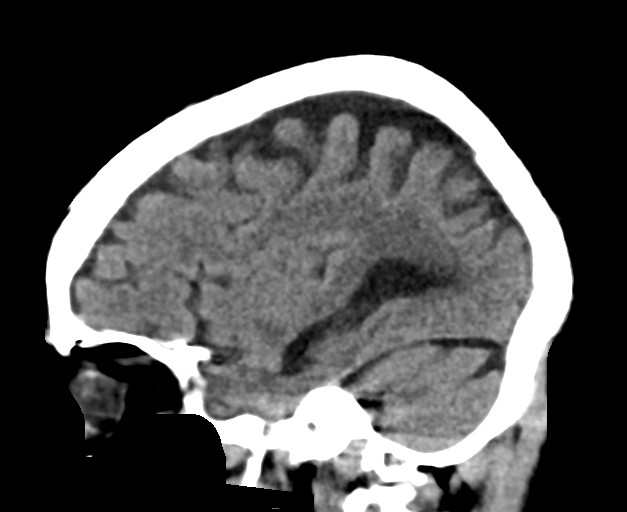
[im 34/51  brain]
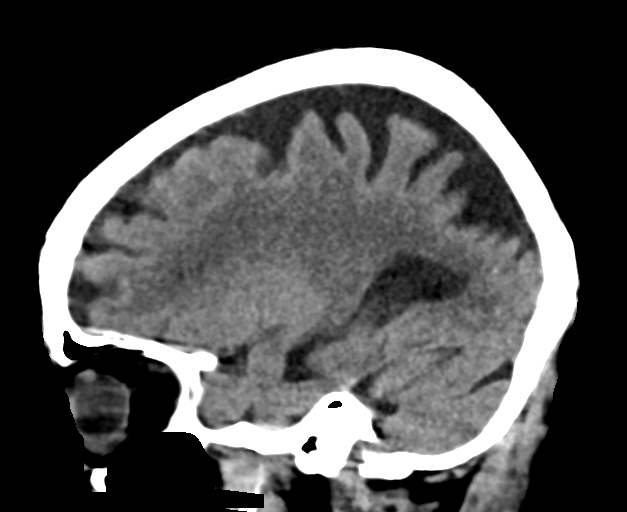

[Series 7: head wo · axial · 0.38mm/px · z∈[+176,+226]mm · 2 of 31 slices shown (2 of 2)]
[im 11/31  brain]
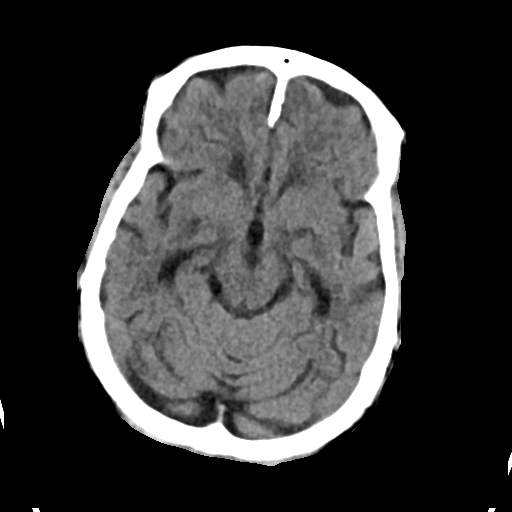
[im 21/31  brain]
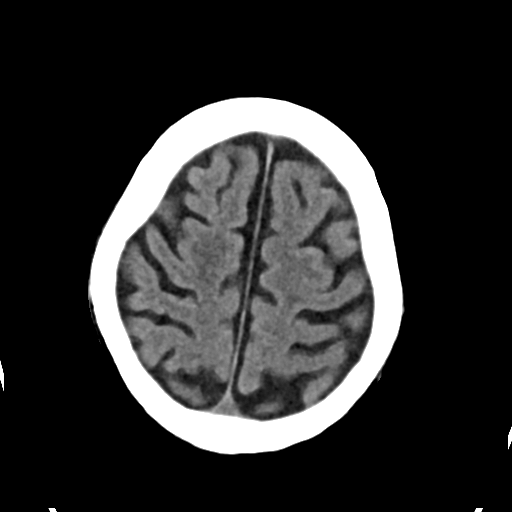

[17 of 47 positions shown; findings below may reference images not displayed]

FINDINGS: Brain: Generalized atrophy. Chronic small-vessel ischemic changes of
the cerebral hemispheric white matter. No sign of recent infarction,
mass lesion, hemorrhage, hydrocephalus or extra-axial collection.

Vascular: There is atherosclerotic calcification of the major
vessels at the base of the brain.

Skull: Negative

Sinuses/Orbits: Clear/normal

Other: None
IMPRESSION: No acute finding by CT. Atrophy and chronic small-vessel ischemic
changes of the white matter.
